# Patient Record
Sex: Female | Born: 1970 | Race: White | Hispanic: No | Smoking: Former smoker
Health system: Southern US, Community
[De-identification: ages and names within clinical notes are randomized; demographics above are authoritative.]

## PROBLEM LIST (undated history)

## (undated) DIAGNOSIS — N6310 Unspecified lump in the right breast, unspecified quadrant: Secondary | ICD-10-CM

## (undated) DIAGNOSIS — F419 Anxiety disorder, unspecified: Secondary | ICD-10-CM

## (undated) DIAGNOSIS — K625 Hemorrhage of anus and rectum: Secondary | ICD-10-CM

## (undated) DIAGNOSIS — M79671 Pain in right foot: Secondary | ICD-10-CM

## (undated) DIAGNOSIS — R519 Headache, unspecified: Secondary | ICD-10-CM

## (undated) DIAGNOSIS — D229 Melanocytic nevi, unspecified: Secondary | ICD-10-CM

## (undated) DIAGNOSIS — R51 Headache: Secondary | ICD-10-CM

## (undated) DIAGNOSIS — M47812 Spondylosis without myelopathy or radiculopathy, cervical region: Secondary | ICD-10-CM

## (undated) DIAGNOSIS — F329 Major depressive disorder, single episode, unspecified: Secondary | ICD-10-CM

## (undated) DIAGNOSIS — F32A Depression, unspecified: Secondary | ICD-10-CM

## (undated) DIAGNOSIS — E559 Vitamin D deficiency, unspecified: Secondary | ICD-10-CM

## (undated) DIAGNOSIS — Z8639 Personal history of other endocrine, nutritional and metabolic disease: Secondary | ICD-10-CM

## (undated) DIAGNOSIS — R928 Other abnormal and inconclusive findings on diagnostic imaging of breast: Secondary | ICD-10-CM

## (undated) HISTORY — DX: Vitamin D deficiency, unspecified: E55.9

## (undated) HISTORY — DX: Pain in right foot: M79.671

## (undated) HISTORY — DX: Melanocytic nevi, unspecified: D22.9

## (undated) HISTORY — PX: TUBAL LIGATION: SHX77

## (undated) HISTORY — PX: ENDOMETRIAL ABLATION: SHX621

## (undated) HISTORY — DX: Hemorrhage of anus and rectum: K62.5

## (undated) HISTORY — PX: WISDOM TOOTH EXTRACTION: SHX21

## (undated) HISTORY — DX: Spondylosis without myelopathy or radiculopathy, cervical region: M47.812

## (undated) HISTORY — DX: Personal history of other endocrine, nutritional and metabolic disease: Z86.39

## (undated) HISTORY — DX: Anxiety disorder, unspecified: F41.9

## (undated) HISTORY — DX: Major depressive disorder, single episode, unspecified: F32.9

## (undated) HISTORY — DX: Depression, unspecified: F32.A

---

## 1998-10-25 ENCOUNTER — Ambulatory Visit (HOSPITAL_COMMUNITY): Admission: RE | Admit: 1998-10-25 | Discharge: 1998-10-25 | Payer: Self-pay | Admitting: Obstetrics and Gynecology

## 1998-10-25 ENCOUNTER — Encounter: Payer: Self-pay | Admitting: Obstetrics and Gynecology

## 2002-12-01 ENCOUNTER — Other Ambulatory Visit: Admission: RE | Admit: 2002-12-01 | Discharge: 2002-12-01 | Payer: Self-pay | Admitting: Obstetrics and Gynecology

## 2003-12-12 ENCOUNTER — Other Ambulatory Visit: Admission: RE | Admit: 2003-12-12 | Discharge: 2003-12-12 | Payer: Self-pay | Admitting: Obstetrics and Gynecology

## 2004-01-24 ENCOUNTER — Ambulatory Visit (HOSPITAL_COMMUNITY): Admission: RE | Admit: 2004-01-24 | Discharge: 2004-01-24 | Payer: Self-pay | Admitting: Obstetrics and Gynecology

## 2004-12-12 ENCOUNTER — Other Ambulatory Visit: Admission: RE | Admit: 2004-12-12 | Discharge: 2004-12-12 | Payer: Self-pay | Admitting: Obstetrics and Gynecology

## 2005-12-29 ENCOUNTER — Other Ambulatory Visit: Admission: RE | Admit: 2005-12-29 | Discharge: 2005-12-29 | Payer: Self-pay | Admitting: Obstetrics and Gynecology

## 2011-04-05 ENCOUNTER — Emergency Department (HOSPITAL_COMMUNITY)
Admission: EM | Admit: 2011-04-05 | Discharge: 2011-04-05 | Disposition: A | Discharge status: Home / Self Care | Payer: BC Managed Care – PPO | Attending: Emergency Medicine | Admitting: Emergency Medicine

## 2011-04-05 DIAGNOSIS — F3289 Other specified depressive episodes: Secondary | ICD-10-CM | POA: Insufficient documentation

## 2011-04-05 DIAGNOSIS — K625 Hemorrhage of anus and rectum: Secondary | ICD-10-CM | POA: Insufficient documentation

## 2011-04-05 DIAGNOSIS — F329 Major depressive disorder, single episode, unspecified: Secondary | ICD-10-CM | POA: Insufficient documentation

## 2011-04-05 LAB — COMPREHENSIVE METABOLIC PANEL
ALT: 14 U/L (ref 0–35)
AST: 15 U/L (ref 0–37)
Albumin: 4.3 g/dL (ref 3.5–5.2)
Alkaline Phosphatase: 47 U/L (ref 39–117)
BUN: 9 mg/dL (ref 6–23)
CO2: 29 mEq/L (ref 19–32)
Calcium: 9.9 mg/dL (ref 8.4–10.5)
Chloride: 105 mEq/L (ref 96–112)
Creatinine, Ser: 0.76 mg/dL (ref 0.4–1.2)
GFR calc Af Amer: >60 mL/min (ref >60)
GFR calc non Af Amer: >60 mL/min (ref >60)
Glucose, Bld: 97 mg/dL (ref 70–99)
Potassium: 4.6 mEq/L (ref 3.5–5.1)
Sodium: 139 mEq/L (ref 135–145)
Total Bilirubin: 0.2 mg/dL — ABNORMAL LOW (ref 0.3–1.2)
Total Protein: 7 g/dL (ref 6.0–8.3)

## 2011-04-05 LAB — DIFFERENTIAL
Basophils Absolute: 0 10*3/uL (ref 0.0–0.1)
Basophils Relative: 0 % (ref 0–1)
Eosinophils Absolute: 0.3 10*3/uL (ref 0.0–0.7)
Eosinophils Relative: 3 % (ref 0–5)
Lymphocytes Relative: 46 % (ref 12–46)
Lymphs Abs: 5 10*3/uL — ABNORMAL HIGH (ref 0.7–4.0)
Monocytes Absolute: 0.6 10*3/uL (ref 0.1–1.0)
Monocytes Relative: 6 % (ref 3–12)
Neutro Abs: 4.8 10*3/uL (ref 1.7–7.7)
Neutrophils Relative %: 45 % (ref 43–77)

## 2011-04-05 LAB — CBC
HCT: 43.2 % (ref 36.0–46.0)
Hemoglobin: 14.5 g/dL (ref 12.0–15.0)
MCH: 30.6 pg (ref 26.0–34.0)
MCHC: 33.6 g/dL (ref 30.0–36.0)
MCV: 91.1 fL (ref 78.0–100.0)
Platelets: 145 10*3/uL — ABNORMAL LOW (ref 150–400)
RBC: 4.74 MIL/uL (ref 3.87–5.11)
RDW: 12.3 % (ref 11.5–15.5)
WBC: 10.7 10*3/uL — ABNORMAL HIGH (ref 4.0–10.5)

## 2011-04-05 LAB — URINALYSIS, ROUTINE W REFLEX MICROSCOPIC
Bilirubin Urine: NEGATIVE
Glucose, UA: NEGATIVE mg/dL
Hgb urine dipstick: NEGATIVE
Ketones, ur: NEGATIVE mg/dL
Nitrite: NEGATIVE
Protein, ur: NEGATIVE mg/dL
Specific Gravity, Urine: 1.009 (ref 1.005–1.030)
Urobilinogen, UA: 0.2 mg/dL (ref 0.0–1.0)
pH: 7 (ref 5.0–8.0)

## 2011-04-05 LAB — OCCULT BLOOD, POC DEVICE: Fecal Occult Bld: POSITIVE

## 2011-04-06 ENCOUNTER — Telehealth: Payer: Self-pay | Admitting: Internal Medicine

## 2011-04-06 ENCOUNTER — Encounter: Payer: Self-pay | Admitting: Internal Medicine

## 2011-04-06 NOTE — Telephone Encounter (Signed)
Error

## 2011-04-06 NOTE — Telephone Encounter (Signed)
Patient is going to see [facility] her primary care MD is working on an appt for her.  She will call back if she wants an appt here.

## 2011-08-01 ENCOUNTER — Emergency Department (HOSPITAL_COMMUNITY): Payer: BC Managed Care – PPO

## 2011-08-01 ENCOUNTER — Emergency Department (HOSPITAL_COMMUNITY)
Admission: EM | Admit: 2011-08-01 | Discharge: 2011-08-01 | Disposition: A | Discharge status: Home / Self Care | Payer: BC Managed Care – PPO | Attending: Emergency Medicine | Admitting: Emergency Medicine

## 2011-08-01 DIAGNOSIS — S5000XA Contusion of unspecified elbow, initial encounter: Secondary | ICD-10-CM | POA: Insufficient documentation

## 2011-08-01 DIAGNOSIS — S51009A Unspecified open wound of unspecified elbow, initial encounter: Secondary | ICD-10-CM | POA: Insufficient documentation

## 2011-08-01 DIAGNOSIS — Y9229 Other specified public building as the place of occurrence of the external cause: Secondary | ICD-10-CM | POA: Insufficient documentation

## 2011-08-01 DIAGNOSIS — W19XXXA Unspecified fall, initial encounter: Secondary | ICD-10-CM | POA: Insufficient documentation

## 2015-02-28 ENCOUNTER — Other Ambulatory Visit: Payer: Self-pay | Admitting: Family Medicine

## 2015-02-28 ENCOUNTER — Ambulatory Visit
Admission: RE | Admit: 2015-02-28 | Discharge: 2015-02-28 | Disposition: A | Discharge status: Home / Self Care | Payer: 59 | Source: Ambulatory Visit | Attending: Family Medicine | Admitting: Family Medicine

## 2015-02-28 DIAGNOSIS — M25531 Pain in right wrist: Secondary | ICD-10-CM

## 2015-10-24 ENCOUNTER — Other Ambulatory Visit: Payer: Self-pay | Admitting: Obstetrics and Gynecology

## 2015-10-24 DIAGNOSIS — R928 Other abnormal and inconclusive findings on diagnostic imaging of breast: Secondary | ICD-10-CM

## 2015-10-29 ENCOUNTER — Other Ambulatory Visit: Payer: Self-pay

## 2015-10-29 ENCOUNTER — Other Ambulatory Visit: Payer: Self-pay | Admitting: Obstetrics and Gynecology

## 2015-10-29 DIAGNOSIS — R928 Other abnormal and inconclusive findings on diagnostic imaging of breast: Secondary | ICD-10-CM

## 2015-10-31 ENCOUNTER — Ambulatory Visit
Admission: RE | Admit: 2015-10-31 | Discharge: 2015-10-31 | Disposition: A | Discharge status: Home / Self Care | Payer: 59 | Source: Ambulatory Visit | Attending: Obstetrics and Gynecology | Admitting: Obstetrics and Gynecology

## 2015-10-31 DIAGNOSIS — R928 Other abnormal and inconclusive findings on diagnostic imaging of breast: Secondary | ICD-10-CM

## 2015-12-30 ENCOUNTER — Other Ambulatory Visit: Payer: Self-pay | Admitting: Family Medicine

## 2015-12-30 ENCOUNTER — Ambulatory Visit
Admission: RE | Admit: 2015-12-30 | Discharge: 2015-12-30 | Disposition: A | Discharge status: Home / Self Care | Payer: 59 | Source: Ambulatory Visit | Attending: Family Medicine | Admitting: Family Medicine

## 2015-12-30 DIAGNOSIS — M542 Cervicalgia: Secondary | ICD-10-CM

## 2016-02-12 HISTORY — PX: CERVICAL DISC SURGERY: SHX588

## 2016-11-10 ENCOUNTER — Other Ambulatory Visit: Payer: Self-pay | Admitting: Obstetrics and Gynecology

## 2016-11-10 DIAGNOSIS — R928 Other abnormal and inconclusive findings on diagnostic imaging of breast: Secondary | ICD-10-CM

## 2016-11-16 ENCOUNTER — Ambulatory Visit
Admission: RE | Admit: 2016-11-16 | Discharge: 2016-11-16 | Disposition: A | Discharge status: Home / Self Care | Payer: BLUE CROSS/BLUE SHIELD | Source: Ambulatory Visit | Attending: Obstetrics and Gynecology | Admitting: Obstetrics and Gynecology

## 2016-11-16 ENCOUNTER — Other Ambulatory Visit: Payer: Self-pay | Admitting: Obstetrics and Gynecology

## 2016-11-16 DIAGNOSIS — N6489 Other specified disorders of breast: Secondary | ICD-10-CM

## 2016-11-16 DIAGNOSIS — R928 Other abnormal and inconclusive findings on diagnostic imaging of breast: Secondary | ICD-10-CM

## 2016-11-17 ENCOUNTER — Ambulatory Visit
Admission: RE | Admit: 2016-11-17 | Discharge: 2016-11-17 | Disposition: A | Discharge status: Home / Self Care | Payer: BLUE CROSS/BLUE SHIELD | Source: Ambulatory Visit | Attending: Obstetrics and Gynecology | Admitting: Obstetrics and Gynecology

## 2016-11-17 ENCOUNTER — Other Ambulatory Visit: Payer: Self-pay | Admitting: Obstetrics and Gynecology

## 2016-11-17 ENCOUNTER — Other Ambulatory Visit: Payer: 59

## 2016-11-17 DIAGNOSIS — N6489 Other specified disorders of breast: Secondary | ICD-10-CM

## 2016-11-23 ENCOUNTER — Ambulatory Visit
Admission: RE | Admit: 2016-11-23 | Discharge: 2016-11-23 | Disposition: A | Discharge status: Home / Self Care | Payer: BLUE CROSS/BLUE SHIELD | Source: Ambulatory Visit | Attending: Obstetrics and Gynecology | Admitting: Obstetrics and Gynecology

## 2016-11-23 DIAGNOSIS — N6489 Other specified disorders of breast: Secondary | ICD-10-CM

## 2016-12-11 ENCOUNTER — Other Ambulatory Visit: Payer: Self-pay | Admitting: General Surgery

## 2016-12-11 DIAGNOSIS — R928 Other abnormal and inconclusive findings on diagnostic imaging of breast: Secondary | ICD-10-CM

## 2016-12-23 ENCOUNTER — Encounter (HOSPITAL_BASED_OUTPATIENT_CLINIC_OR_DEPARTMENT_OTHER)
Admission: RE | Admit: 2016-12-23 | Discharge: 2016-12-23 | Disposition: A | Discharge status: Home / Self Care | Payer: BLUE CROSS/BLUE SHIELD | Source: Ambulatory Visit | Attending: General Surgery | Admitting: General Surgery

## 2016-12-23 ENCOUNTER — Encounter (HOSPITAL_BASED_OUTPATIENT_CLINIC_OR_DEPARTMENT_OTHER): Payer: Self-pay | Admitting: *Deleted

## 2016-12-23 DIAGNOSIS — Z01812 Encounter for preprocedural laboratory examination: Secondary | ICD-10-CM | POA: Diagnosis not present

## 2016-12-23 DIAGNOSIS — Z79899 Other long term (current) drug therapy: Secondary | ICD-10-CM | POA: Diagnosis not present

## 2016-12-23 DIAGNOSIS — Z8261 Family history of arthritis: Secondary | ICD-10-CM | POA: Diagnosis not present

## 2016-12-23 DIAGNOSIS — R928 Other abnormal and inconclusive findings on diagnostic imaging of breast: Secondary | ICD-10-CM | POA: Insufficient documentation

## 2016-12-23 DIAGNOSIS — Z87891 Personal history of nicotine dependence: Secondary | ICD-10-CM | POA: Diagnosis not present

## 2016-12-23 DIAGNOSIS — Z803 Family history of malignant neoplasm of breast: Secondary | ICD-10-CM | POA: Diagnosis not present

## 2016-12-23 DIAGNOSIS — Z9889 Other specified postprocedural states: Secondary | ICD-10-CM | POA: Insufficient documentation

## 2016-12-23 DIAGNOSIS — Z811 Family history of alcohol abuse and dependence: Secondary | ICD-10-CM | POA: Insufficient documentation

## 2016-12-23 DIAGNOSIS — Z880 Allergy status to penicillin: Secondary | ICD-10-CM | POA: Insufficient documentation

## 2016-12-23 DIAGNOSIS — Z818 Family history of other mental and behavioral disorders: Secondary | ICD-10-CM | POA: Insufficient documentation

## 2016-12-23 LAB — COMPREHENSIVE METABOLIC PANEL
ALBUMIN: 4.3 g/dL (ref 3.5–5.0)
ALK PHOS: 62 U/L (ref 38–126)
ALT: 17 U/L (ref 14–54)
ANION GAP: 10 (ref 5–15)
AST: 20 U/L (ref 15–41)
BILIRUBIN TOTAL: 0.3 mg/dL (ref 0.3–1.2)
BUN: 16 mg/dL (ref 6–20)
CALCIUM: 10.2 mg/dL (ref 8.9–10.3)
CO2: 27 mmol/L (ref 22–32)
CREATININE: 0.74 mg/dL (ref 0.44–1.00)
Chloride: 102 mmol/L (ref 101–111)
GFR calc Af Amer: >60 mL/min (ref >60)
GFR calc non Af Amer: >60 mL/min (ref >60)
GLUCOSE: 103 mg/dL — AB (ref 65–99)
Potassium: 4.7 mmol/L (ref 3.5–5.1)
SODIUM: 139 mmol/L (ref 135–145)
TOTAL PROTEIN: 7.3 g/dL (ref 6.5–8.1)

## 2016-12-23 LAB — CBC WITH DIFFERENTIAL/PLATELET
BASOS PCT: 0 %
Basophils Absolute: 0 10*3/uL (ref 0.0–0.1)
EOS ABS: 0.1 10*3/uL (ref 0.0–0.7)
Eosinophils Relative: 1 %
HEMATOCRIT: 42.6 % (ref 36.0–46.0)
HEMOGLOBIN: 14.3 g/dL (ref 12.0–15.0)
Lymphocytes Relative: 33 %
Lymphs Abs: 2.7 10*3/uL (ref 0.7–4.0)
MCH: 30 pg (ref 26.0–34.0)
MCHC: 33.6 g/dL (ref 30.0–36.0)
MCV: 89.5 fL (ref 78.0–100.0)
MONOS PCT: 6 %
Monocytes Absolute: 0.5 10*3/uL (ref 0.1–1.0)
NEUTROS ABS: 5 10*3/uL (ref 1.7–7.7)
NEUTROS PCT: 60 %
Platelets: 186 10*3/uL (ref 150–400)
RBC: 4.76 MIL/uL (ref 3.87–5.11)
RDW: 12.7 % (ref 11.5–15.5)
WBC: 8.3 10*3/uL (ref 4.0–10.5)

## 2016-12-23 NOTE — Progress Notes (Signed)
Patient came directly over from [facility] after being scheduled for surgery, concerned that she was just treated yesterday for peritonsillar abscess. She was given Clindamycin and has already taken 3 doses and states she feel better already. States no fever and that now she can swallow and eat. Told her to make sure she finishes all of her antibiotics and to call if she does not improve.

## 2016-12-24 ENCOUNTER — Other Ambulatory Visit: Payer: Self-pay | Admitting: General Surgery

## 2016-12-24 DIAGNOSIS — R928 Other abnormal and inconclusive findings on diagnostic imaging of breast: Secondary | ICD-10-CM

## 2017-01-12 ENCOUNTER — Ambulatory Visit
Admission: RE | Admit: 2017-01-12 | Discharge: 2017-01-12 | Disposition: A | Discharge status: Home / Self Care | Payer: BLUE CROSS/BLUE SHIELD | Source: Ambulatory Visit | Attending: General Surgery | Admitting: General Surgery

## 2017-01-12 DIAGNOSIS — R928 Other abnormal and inconclusive findings on diagnostic imaging of breast: Secondary | ICD-10-CM

## 2017-01-12 NOTE — H&P (Signed)
Meredeth Ide Location: [facility] Patient #: [id number] DOB: [birth year] Single / Language: [place] / Race: White Female       History of Present Illness  The patient is a 46 year old female who presents with a complaint of Breast problems. This is a healthy 46 year old female, referred to me by Dr. 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 She works at [employer] and knows Rhett Bannister, a friend of mine.  She has not had any breast problems in the past. Recent screening mammogram showed category C density, focal area of distortion in the right breast upper outer quadrant. No mass or calcifications. Core biopsy shows complex sclerosing lesion. This was felt to be a category 4 mammogram. Excision was recommended by the radiologist.  Comorbidities are minimal. She's had cervical disc surgery in March of this year and did well. Bilateral tubal ligation. Takes Zoloft. Family history reveals mother diagnosed with breast cancer age 78. Living and survived. No ovarian cancer. Socially she is married. Lives in [place]. Her husband is with her today. She has 2 children from previous marriage. Quit smoking 10 months ago. Takes alcohol rarely. 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  We had a long talk about complex sclerosing lesion. I told her that the risk of cancer or upgrade to atypia was somewhere between 4 and 9%. Told her she probably did not have cancer. She would like to have this area excised conservatively. I certainly offered that to her and we will plan that. She will be scheduled for right breast lumpectomy with radioactive seed localization. I discussed the indications, techniques, incisions, and risk of the surgery in detail. She understands all of these issues. All of her questions are answered. She  agrees with this plan.   Past Surgical History  Breast Biopsy  Right. Oral Surgery  Spinal Surgery - Neck   Diagnostic Studies History  Colonoscopy  1-5 years ago Mammogram  within last year Pap Smear  1-5 years ago  Allergies  Ampicillin *PENICILLINS*   Medication History  Diclofenac Sodium (75MG  Tablet DR, Oral daily) Active. Zoloft (50MG  Tablet, Oral daily) Active. Tylenol (325MG  Tablet, Oral daily) Active. Calcium + D (150-261-330MG -MG-IU Capsule, Oral daily) Active. Vitamin D (Cholecalciferol) (1000UNIT Tablet, Oral daily) Active. Medications Reconciled  Social History  Alcohol use  Occasional alcohol use. Caffeine use  Coffee. No drug use  Tobacco use  Former smoker.  Family History  Alcohol Abuse  Brother, Father. Arthritis  Mother. Breast Cancer  Mother. Colon Polyps  Mother. Depression  Mother. Heart Disease  Father. Hypertension  Mother. Ischemic Bowel Disease  Mother. Migraine Headache  Mother, Son.  Pregnancy / Birth History  Age at menarche  11 years. Gravida  2 Maternal age  54-25 Para  2  Other Problems Anxiety Disorder  Back Pain  Bladder Problems  Hemorrhoids  Migraine Headache     Review of Systems  General Present- Weight Gain. Not Present- Appetite Loss, Chills, Fatigue, Fever, Night Sweats and Weight Loss. Skin Not Present- Change in Wart/Mole, Dryness, Hives, Jaundice, New Lesions, Non-Healing Wounds, Rash and Ulcer. HEENT Present- Hearing Loss, Seasonal Allergies and Wears glasses/contact lenses. Not Present- Earache, Hoarseness, Nose Bleed, Oral Ulcers, Ringing in the Ears, Sinus Pain, Sore Throat, Visual Disturbances and Yellow Eyes. Respiratory Present- Snoring. Not Present- Bloody sputum, Chronic Cough, Difficulty Breathing and Wheezing. Breast  Present- Breast Mass. Not Present- Breast Pain, Nipple Discharge and Skin Changes. Cardiovascular Present- Palpitations. Not Present- Chest Pain,  Difficulty Breathing Lying Down, Leg Cramps, Rapid Heart Rate, Shortness of Breath and Swelling of Extremities. Gastrointestinal Present- Bloating and Hemorrhoids. Not Present- Abdominal Pain, Bloody Stool, Change in Bowel Habits, Chronic diarrhea, Constipation, Difficulty Swallowing, Excessive gas, Gets full quickly at meals, Indigestion, Nausea, Rectal Pain and Vomiting. Female Genitourinary Present- Frequency and Nocturia. Not Present- Painful Urination, Pelvic Pain and Urgency. Musculoskeletal Present- Joint Pain, Joint Stiffness and Muscle Pain. Not Present- Back Pain, Muscle Weakness and Swelling of Extremities. Neurological Not Present- Decreased Memory, Fainting, Headaches, Numbness, Seizures, Tingling, Tremor, Trouble walking and Weakness. Psychiatric Present- Anxiety. Not Present- Bipolar, Change in Sleep Pattern, Depression, Fearful and Frequent crying. Endocrine Present- Hot flashes. Not Present- Cold Intolerance, Excessive Hunger, Hair Changes, Heat Intolerance and New Diabetes. Hematology Not Present- Blood Thinners, Easy Bruising, Excessive bleeding, Gland problems, HIV and Persistent Infections.  Vitals Weight: 178.2 lb Height: 69in Body Surface Area: 1.97 m Body Mass Index: 26.32 kg/m  Temp.: 98.37F  Pulse: 84 (Regular)  BP: 118/70 (Sitting, Left Arm, Standard)     Physical Exam  General Mental Status-Alert. General Appearance-Consistent with stated age. Hydration-Well hydrated. Voice-Normal.  Head and Neck Head-normocephalic, atraumatic with no lesions or palpable masses. Trachea-midline. Thyroid Gland Characteristics - normal size and consistency.  Eye Eyeball - Bilateral-Extraocular movements intact. Sclera/Conjunctiva - Bilateral-No scleral icterus.  Chest and Lung Exam Chest and lung exam reveals -quiet, even and easy respiratory effort with no use of accessory muscles and on auscultation, normal breath sounds, no adventitious  sounds and normal vocal resonance. Inspection Chest Wall - Normal. Back - normal.  Breast Note: Breasts are medium to medium large in size. Biopsy site lateral right breast. No hematoma. No palpable mass in either breast. Nipple and areolar complex is normal. No axillary adenopathy.   Cardiovascular Cardiovascular examination reveals -normal heart sounds, regular rate and rhythm with no murmurs and normal pedal pulses bilaterally.  Abdomen Inspection Inspection of the abdomen reveals - No Hernias. Skin - Scar - no surgical scars. Palpation/Percussion Palpation and Percussion of the abdomen reveal - Soft, Non Tender, No Rebound tenderness, No Rigidity (guarding) and No hepatosplenomegaly. Auscultation Auscultation of the abdomen reveals - Bowel sounds normal.  Neurologic Neurologic evaluation reveals -alert and oriented x 3 with no impairment of recent or remote memory. Mental Status-Normal.  Musculoskeletal Normal Exam - Left-Upper Extremity Strength Normal and Lower Extremity Strength Normal. Normal Exam - Right-Upper Extremity Strength Normal and Lower Extremity Strength Normal.  Lymphatic Head & Neck  General Head & Neck Lymphatics: Bilateral - Description - Normal. Axillary  General Axillary Region: Bilateral - Description - Normal. Tenderness - Non Tender. Femoral & Inguinal  Generalized Femoral & Inguinal Lymphatics: Bilateral - Description - Normal. Tenderness - Non Tender.    Assessment & Plan  ABNORMAL MAMMOGRAM OF RIGHT BREAST (R92.8)   Your recent mammograms and biopsy show an area of distortion in the right breast, upper outer quadrant. The needle biopsy shows a complex sclerosing lesion The literature documents a 4-9% chance of upgrading this to atypia or in situ cancer  Most likely you do not have cancer We have discussed this in detail We have decided to proceed with right breast lumpectomy with radioactive seed localization to be 100% sure  of your diagnosis We have discussed the indications, techniques, and risks of this surgery in detail  Please read the printed information that we have given  you  FAMILY HISTORY OF BREAST CANCER IN FIRST DEGREE RELATIVE (Z80.3)     Angelia MouldHaywood M. Derrell LollingIngram, M.D., [facility] General and Minimally invasive Surgery Breast and Colorectal Surgery Office:   [phone] Pager:   [phone]

## 2017-01-13 ENCOUNTER — Encounter (HOSPITAL_BASED_OUTPATIENT_CLINIC_OR_DEPARTMENT_OTHER)
Admission: RE | Disposition: A | Discharge status: Home / Self Care | Payer: Self-pay | Source: Ambulatory Visit | Attending: General Surgery

## 2017-01-13 ENCOUNTER — Ambulatory Visit (HOSPITAL_BASED_OUTPATIENT_CLINIC_OR_DEPARTMENT_OTHER): Payer: BLUE CROSS/BLUE SHIELD | Admitting: Anesthesiology

## 2017-01-13 ENCOUNTER — Ambulatory Visit
Admission: RE | Admit: 2017-01-13 | Discharge: 2017-01-13 | Disposition: A | Discharge status: Home / Self Care | Payer: BLUE CROSS/BLUE SHIELD | Source: Ambulatory Visit | Attending: General Surgery | Admitting: General Surgery

## 2017-01-13 ENCOUNTER — Encounter (HOSPITAL_BASED_OUTPATIENT_CLINIC_OR_DEPARTMENT_OTHER): Payer: Self-pay | Admitting: Anesthesiology

## 2017-01-13 ENCOUNTER — Ambulatory Visit (HOSPITAL_BASED_OUTPATIENT_CLINIC_OR_DEPARTMENT_OTHER)
Admission: RE | Admit: 2017-01-13 | Discharge: 2017-01-13 | Disposition: A | Discharge status: Home / Self Care | Payer: BLUE CROSS/BLUE SHIELD | Source: Ambulatory Visit | Attending: General Surgery | Admitting: General Surgery

## 2017-01-13 DIAGNOSIS — N6021 Fibroadenosis of right breast: Secondary | ICD-10-CM | POA: Insufficient documentation

## 2017-01-13 DIAGNOSIS — Z820 Family history of epilepsy and other diseases of the nervous system: Secondary | ICD-10-CM | POA: Diagnosis not present

## 2017-01-13 DIAGNOSIS — F419 Anxiety disorder, unspecified: Secondary | ICD-10-CM | POA: Insufficient documentation

## 2017-01-13 DIAGNOSIS — Z803 Family history of malignant neoplasm of breast: Secondary | ICD-10-CM | POA: Insufficient documentation

## 2017-01-13 DIAGNOSIS — R928 Other abnormal and inconclusive findings on diagnostic imaging of breast: Secondary | ICD-10-CM

## 2017-01-13 DIAGNOSIS — Z818 Family history of other mental and behavioral disorders: Secondary | ICD-10-CM | POA: Diagnosis not present

## 2017-01-13 DIAGNOSIS — Z880 Allergy status to penicillin: Secondary | ICD-10-CM | POA: Insufficient documentation

## 2017-01-13 DIAGNOSIS — Z811 Family history of alcohol abuse and dependence: Secondary | ICD-10-CM | POA: Diagnosis not present

## 2017-01-13 DIAGNOSIS — Z8249 Family history of ischemic heart disease and other diseases of the circulatory system: Secondary | ICD-10-CM | POA: Insufficient documentation

## 2017-01-13 DIAGNOSIS — Z8379 Family history of other diseases of the digestive system: Secondary | ICD-10-CM | POA: Insufficient documentation

## 2017-01-13 DIAGNOSIS — Z79899 Other long term (current) drug therapy: Secondary | ICD-10-CM | POA: Diagnosis not present

## 2017-01-13 DIAGNOSIS — Z791 Long term (current) use of non-steroidal anti-inflammatories (NSAID): Secondary | ICD-10-CM | POA: Insufficient documentation

## 2017-01-13 DIAGNOSIS — Z8371 Family history of colonic polyps: Secondary | ICD-10-CM | POA: Insufficient documentation

## 2017-01-13 DIAGNOSIS — G43909 Migraine, unspecified, not intractable, without status migrainosus: Secondary | ICD-10-CM | POA: Diagnosis not present

## 2017-01-13 DIAGNOSIS — Z8261 Family history of arthritis: Secondary | ICD-10-CM | POA: Insufficient documentation

## 2017-01-13 DIAGNOSIS — N6011 Diffuse cystic mastopathy of right breast: Secondary | ICD-10-CM | POA: Diagnosis not present

## 2017-01-13 DIAGNOSIS — Z87891 Personal history of nicotine dependence: Secondary | ICD-10-CM | POA: Diagnosis not present

## 2017-01-13 HISTORY — DX: Other abnormal and inconclusive findings on diagnostic imaging of breast: R92.8

## 2017-01-13 HISTORY — DX: Headache, unspecified: R51.9

## 2017-01-13 HISTORY — DX: Unspecified lump in the right breast, unspecified quadrant: N63.10

## 2017-01-13 HISTORY — DX: Headache: R51

## 2017-01-13 HISTORY — PX: BREAST LUMPECTOMY WITH RADIOACTIVE SEED LOCALIZATION: SHX6424

## 2017-01-13 SURGERY — BREAST LUMPECTOMY WITH RADIOACTIVE SEED LOCALIZATION
Anesthesia: General | Site: Breast | Laterality: Right

## 2017-01-13 MED ORDER — ONDANSETRON HCL 4 MG/2ML IJ SOLN
INTRAMUSCULAR | Status: DC | PRN
  Administered 2017-01-13: 4 mg via INTRAVENOUS

## 2017-01-13 MED ORDER — CHLORHEXIDINE GLUCONATE CLOTH 2 % EX PADS: 6.0000 | MEDICATED_PAD | Freq: Once | CUTANEOUS | Status: DC

## 2017-01-13 MED ORDER — GABAPENTIN 300 MG PO CAPS
300.0000 mg | ORAL_CAPSULE | ORAL | Status: AC
  Administered 2017-01-13: 300 mg via ORAL

## 2017-01-13 MED ORDER — LIDOCAINE 2% (20 MG/ML) 5 ML SYRINGE
INTRAMUSCULAR | Status: AC
  Filled 2017-01-13: qty 5

## 2017-01-13 MED ORDER — FENTANYL CITRATE (PF) 100 MCG/2ML IJ SOLN
INTRAMUSCULAR | Status: AC
  Filled 2017-01-13: qty 2

## 2017-01-13 MED ORDER — DEXAMETHASONE SODIUM PHOSPHATE 4 MG/ML IJ SOLN
INTRAMUSCULAR | Status: DC | PRN
  Administered 2017-01-13: 10 mg via INTRAVENOUS

## 2017-01-13 MED ORDER — FENTANYL CITRATE (PF) 100 MCG/2ML IJ SOLN: 25.0000 ug | INTRAMUSCULAR | Status: DC | PRN

## 2017-01-13 MED ORDER — PHENYLEPHRINE 40 MCG/ML (10ML) SYRINGE FOR IV PUSH (FOR BLOOD PRESSURE SUPPORT)
PREFILLED_SYRINGE | INTRAVENOUS | Status: AC
  Filled 2017-01-13: qty 10

## 2017-01-13 MED ORDER — MIDAZOLAM HCL 2 MG/2ML IJ SOLN: 1.0000 mg | INTRAMUSCULAR | Status: DC | PRN

## 2017-01-13 MED ORDER — SODIUM CHLORIDE 0.9% FLUSH: 3.0000 mL | Freq: Two times a day (BID) | INTRAVENOUS | Status: DC

## 2017-01-13 MED ORDER — LACTATED RINGERS IV SOLN
INTRAVENOUS | Status: DC
  Administered 2017-01-13: 09:00:00 via INTRAVENOUS
  Administered 2017-01-13: 10 mL/h via INTRAVENOUS

## 2017-01-13 MED ORDER — OXYCODONE HCL 5 MG PO TABS
ORAL_TABLET | ORAL | Status: AC
  Filled 2017-01-13: qty 1

## 2017-01-13 MED ORDER — CELECOXIB 400 MG PO CAPS
400.0000 mg | ORAL_CAPSULE | ORAL | Status: AC
  Administered 2017-01-13: 400 mg via ORAL

## 2017-01-13 MED ORDER — MEPERIDINE HCL 25 MG/ML IJ SOLN: 6.2500 mg | INTRAMUSCULAR | Status: DC | PRN

## 2017-01-13 MED ORDER — ACETAMINOPHEN 325 MG PO TABS: 650.0000 mg | ORAL_TABLET | ORAL | Status: DC | PRN

## 2017-01-13 MED ORDER — LACTATED RINGERS IV SOLN: INTRAVENOUS | Status: DC

## 2017-01-13 MED ORDER — DEXAMETHASONE SODIUM PHOSPHATE 10 MG/ML IJ SOLN
INTRAMUSCULAR | Status: AC
  Filled 2017-01-13: qty 1

## 2017-01-13 MED ORDER — PHENYLEPHRINE HCL 10 MG/ML IJ SOLN
INTRAMUSCULAR | Status: DC | PRN
  Administered 2017-01-13 (×3): 80 ug via INTRAVENOUS

## 2017-01-13 MED ORDER — EPHEDRINE SULFATE 50 MG/ML IJ SOLN
INTRAMUSCULAR | Status: DC | PRN
  Administered 2017-01-13: 10 mg via INTRAVENOUS

## 2017-01-13 MED ORDER — HYDROMORPHONE HCL 1 MG/ML IJ SOLN
0.2500 mg | INTRAMUSCULAR | Status: DC | PRN
  Administered 2017-01-13: 0.5 mg via INTRAVENOUS

## 2017-01-13 MED ORDER — GABAPENTIN 300 MG PO CAPS
ORAL_CAPSULE | ORAL | Status: AC
  Filled 2017-01-13: qty 1

## 2017-01-13 MED ORDER — ACETAMINOPHEN 650 MG RE SUPP: 650.0000 mg | RECTAL | Status: DC | PRN

## 2017-01-13 MED ORDER — OXYCODONE HCL 5 MG PO TABS: 5.0000 mg | ORAL_TABLET | ORAL | Status: DC | PRN

## 2017-01-13 MED ORDER — SODIUM CHLORIDE 0.9 % IV SOLN: 250.0000 mL | INTRAVENOUS | Status: DC | PRN

## 2017-01-13 MED ORDER — HYDROCODONE-ACETAMINOPHEN 5-325 MG PO TABS: 1.0000 | ORAL_TABLET | Freq: Four times a day (QID) | ORAL | 0 refills | Status: DC | PRN

## 2017-01-13 MED ORDER — PROPOFOL 10 MG/ML IV BOLUS
INTRAVENOUS | Status: AC
  Filled 2017-01-13: qty 20

## 2017-01-13 MED ORDER — ACETAMINOPHEN 500 MG PO TABS
1000.0000 mg | ORAL_TABLET | ORAL | Status: AC
  Administered 2017-01-13: 1000 mg via ORAL

## 2017-01-13 MED ORDER — MIDAZOLAM HCL 5 MG/5ML IJ SOLN
INTRAMUSCULAR | Status: DC | PRN
  Administered 2017-01-13: 2 mg via INTRAVENOUS

## 2017-01-13 MED ORDER — CEFAZOLIN SODIUM-DEXTROSE 2-4 GM/100ML-% IV SOLN
2.0000 g | INTRAVENOUS | Status: AC
  Administered 2017-01-13: 2 g via INTRAVENOUS

## 2017-01-13 MED ORDER — SODIUM CHLORIDE 0.9% FLUSH: 3.0000 mL | INTRAVENOUS | Status: DC | PRN

## 2017-01-13 MED ORDER — ACETAMINOPHEN 500 MG PO TABS
ORAL_TABLET | ORAL | Status: AC
  Filled 2017-01-13: qty 2

## 2017-01-13 MED ORDER — SCOPOLAMINE 1 MG/3DAYS TD PT72: 1.0000 | MEDICATED_PATCH | Freq: Once | TRANSDERMAL | Status: DC | PRN

## 2017-01-13 MED ORDER — OXYCODONE HCL 5 MG/5ML PO SOLN: 5.0000 mg | Freq: Once | ORAL | Status: AC | PRN

## 2017-01-13 MED ORDER — HYDROMORPHONE HCL 1 MG/ML IJ SOLN
INTRAMUSCULAR | Status: AC
  Filled 2017-01-13: qty 1

## 2017-01-13 MED ORDER — FENTANYL CITRATE (PF) 100 MCG/2ML IJ SOLN
INTRAMUSCULAR | Status: DC | PRN
  Administered 2017-01-13: 100 ug via INTRAVENOUS

## 2017-01-13 MED ORDER — BUPIVACAINE-EPINEPHRINE (PF) 0.5% -1:200000 IJ SOLN
INTRAMUSCULAR | Status: DC | PRN
  Administered 2017-01-13: 10 mL

## 2017-01-13 MED ORDER — OXYCODONE HCL 5 MG PO TABS
5.0000 mg | ORAL_TABLET | Freq: Once | ORAL | Status: AC | PRN
  Administered 2017-01-13: 5 mg via ORAL

## 2017-01-13 MED ORDER — PROPOFOL 10 MG/ML IV BOLUS
INTRAVENOUS | Status: DC | PRN
  Administered 2017-01-13: 200 mg via INTRAVENOUS

## 2017-01-13 MED ORDER — FENTANYL CITRATE (PF) 100 MCG/2ML IJ SOLN: 50.0000 ug | INTRAMUSCULAR | Status: DC | PRN

## 2017-01-13 MED ORDER — MIDAZOLAM HCL 2 MG/2ML IJ SOLN
INTRAMUSCULAR | Status: AC
  Filled 2017-01-13: qty 2

## 2017-01-13 MED ORDER — LIDOCAINE HCL (CARDIAC) 20 MG/ML IV SOLN
INTRAVENOUS | Status: DC | PRN
  Administered 2017-01-13: 30 mg via INTRAVENOUS

## 2017-01-13 MED ORDER — PROMETHAZINE HCL 25 MG/ML IJ SOLN: 6.2500 mg | INTRAMUSCULAR | Status: DC | PRN

## 2017-01-13 MED ORDER — ONDANSETRON HCL 4 MG/2ML IJ SOLN
INTRAMUSCULAR | Status: AC
  Filled 2017-01-13: qty 2

## 2017-01-13 MED ORDER — EPHEDRINE 5 MG/ML INJ
INTRAVENOUS | Status: AC
  Filled 2017-01-13: qty 10

## 2017-01-13 MED ORDER — CELECOXIB 200 MG PO CAPS
ORAL_CAPSULE | ORAL | Status: AC
  Filled 2017-01-13: qty 2

## 2017-01-13 MED ORDER — CEFAZOLIN SODIUM-DEXTROSE 2-4 GM/100ML-% IV SOLN
INTRAVENOUS | Status: AC
  Filled 2017-01-13: qty 100

## 2017-01-13 SURGICAL SUPPLY — 57 items
APPLIER CLIP 9.375 MED OPEN (MISCELLANEOUS) ×2
BENZOIN TINCTURE PRP APPL 2/3 (GAUZE/BANDAGES/DRESSINGS) IMPLANT
BINDER BREAST LRG (GAUZE/BANDAGES/DRESSINGS) ×2 IMPLANT
BINDER BREAST MEDIUM (GAUZE/BANDAGES/DRESSINGS) IMPLANT
BINDER BREAST XLRG (GAUZE/BANDAGES/DRESSINGS) IMPLANT
BINDER BREAST XXLRG (GAUZE/BANDAGES/DRESSINGS) IMPLANT
BLADE HEX COATED 2.75 (ELECTRODE) ×2 IMPLANT
BLADE SURG 10 STRL SS (BLADE) IMPLANT
BLADE SURG 15 STRL LF DISP TIS (BLADE) ×1 IMPLANT
BLADE SURG 15 STRL SS (BLADE) ×1
CANISTER SUC SOCK COL 7IN (MISCELLANEOUS) IMPLANT
CANISTER SUCT 1200ML W/VALVE (MISCELLANEOUS) ×2 IMPLANT
CHLORAPREP W/TINT 26ML (MISCELLANEOUS) ×2 IMPLANT
CLIP APPLIE 9.375 MED OPEN (MISCELLANEOUS) ×1 IMPLANT
COVER BACK TABLE 60X90IN (DRAPES) ×2 IMPLANT
COVER MAYO STAND STRL (DRAPES) ×2 IMPLANT
COVER PROBE W GEL 5X96 (DRAPES) ×2 IMPLANT
DECANTER SPIKE VIAL GLASS SM (MISCELLANEOUS) IMPLANT
DERMABOND ADVANCED (GAUZE/BANDAGES/DRESSINGS) ×1
DERMABOND ADVANCED .7 DNX12 (GAUZE/BANDAGES/DRESSINGS) ×1 IMPLANT
DEVICE DUBIN W/COMP PLATE 8390 (MISCELLANEOUS) ×2 IMPLANT
DRAPE LAPAROSCOPIC ABDOMINAL (DRAPES) ×2 IMPLANT
DRAPE UTILITY XL STRL (DRAPES) ×2 IMPLANT
DRSG PAD ABDOMINAL 8X10 ST (GAUZE/BANDAGES/DRESSINGS) IMPLANT
ELECT REM PT RETURN 9FT ADLT (ELECTROSURGICAL) ×2
ELECTRODE REM PT RTRN 9FT ADLT (ELECTROSURGICAL) ×1 IMPLANT
GLOVE EUDERMIC 7 POWDERFREE (GLOVE) ×2 IMPLANT
GOWN STRL REUS W/ TWL LRG LVL3 (GOWN DISPOSABLE) ×1 IMPLANT
GOWN STRL REUS W/ TWL XL LVL3 (GOWN DISPOSABLE) ×1 IMPLANT
GOWN STRL REUS W/TWL LRG LVL3 (GOWN DISPOSABLE) ×1
GOWN STRL REUS W/TWL XL LVL3 (GOWN DISPOSABLE) ×1
ILLUMINATOR WAVEGUIDE N/F (MISCELLANEOUS) IMPLANT
KIT MARKER MARGIN INK (KITS) ×2 IMPLANT
LIGHT WAVEGUIDE WIDE FLAT (MISCELLANEOUS) IMPLANT
NEEDLE HYPO 25X1 1.5 SAFETY (NEEDLE) ×2 IMPLANT
NS IRRIG 1000ML POUR BTL (IV SOLUTION) ×2 IMPLANT
PACK BASIN DAY SURGERY FS (CUSTOM PROCEDURE TRAY) ×2 IMPLANT
PENCIL BUTTON HOLSTER BLD 10FT (ELECTRODE) ×2 IMPLANT
SHEET MEDIUM DRAPE 40X70 STRL (DRAPES) IMPLANT
SLEEVE SCD COMPRESS KNEE MED (MISCELLANEOUS) ×2 IMPLANT
SPONGE GAUZE 4X4 12PLY STER LF (GAUZE/BANDAGES/DRESSINGS) IMPLANT
SPONGE LAP 18X18 X RAY DECT (DISPOSABLE) IMPLANT
SPONGE LAP 4X18 X RAY DECT (DISPOSABLE) ×2 IMPLANT
STRIP CLOSURE SKIN 1/2X4 (GAUZE/BANDAGES/DRESSINGS) IMPLANT
SUT ETHILON 3 0 FSL (SUTURE) IMPLANT
SUT MNCRL AB 4-0 PS2 18 (SUTURE) ×2 IMPLANT
SUT SILK 2 0 SH (SUTURE) ×2 IMPLANT
SUT VIC AB 2-0 CT1 27 (SUTURE)
SUT VIC AB 2-0 CT1 TAPERPNT 27 (SUTURE) IMPLANT
SUT VIC AB 3-0 SH 27 (SUTURE)
SUT VIC AB 3-0 SH 27X BRD (SUTURE) IMPLANT
SUT VICRYL 3-0 CR8 SH (SUTURE) ×2 IMPLANT
SYR 10ML LL (SYRINGE) ×2 IMPLANT
TOWEL OR 17X24 6PK STRL BLUE (TOWEL DISPOSABLE) ×2 IMPLANT
TOWEL OR NON WOVEN STRL DISP B (DISPOSABLE) IMPLANT
TUBE CONNECTING 20X1/4 (TUBING) ×2 IMPLANT
YANKAUER SUCT BULB TIP NO VENT (SUCTIONS) ×2 IMPLANT

## 2017-01-13 NOTE — Interval H&P Note (Signed)
History and Physical Interval Note:  01/13/2017 7:49 AM  Shannon Padilla  has presented today for surgery, with the diagnosis of ABNORMAL MAMMOGRAM RIGHT BREAST  The various methods of treatment have been discussed with the patient and family. After consideration of risks, benefits and other options for treatment, the patient has consented to  Procedure(s): RIGHT BREAST LUMPECTOMY WITH RADIOACTIVE SEED LOCALIZATION (Right) as a surgical intervention .  The patient's history has been reviewed, patient examined, no change in status, stable for surgery.  I have reviewed the patient's chart and labs.  Questions were answered to the patient's satisfaction.     Ernestene MentionINGRAM,[name] M

## 2017-01-13 NOTE — Anesthesia Preprocedure Evaluation (Signed)
Anesthesia Evaluation  Patient identified by MRN, date of birth, ID band Patient awake    Reviewed: Allergy & Precautions, NPO status , Patient's Chart, lab work & pertinent test results  Airway Mallampati: II  TM Distance: >3 FB Neck ROM: Full    Dental no notable dental hx.    Pulmonary neg pulmonary ROS, former smoker,    Pulmonary exam normal breath sounds clear to auscultation       Cardiovascular negative cardio ROS Normal cardiovascular exam Rhythm:Regular Rate:Normal     Neuro/Psych  Headaches, negative neurological ROS  negative psych ROS   GI/Hepatic negative GI ROS, Neg liver ROS,   Endo/Other  negative endocrine ROS  Renal/GU negative Renal ROS     Musculoskeletal negative musculoskeletal ROS (+)   Abdominal   Peds  Hematology negative hematology ROS (+)   Anesthesia Other Findings   Reproductive/Obstetrics negative OB ROS                             Anesthesia Physical Anesthesia Plan  ASA: II  Anesthesia Plan: General   Post-op Pain Management:    Induction: Intravenous  Airway Management Planned: LMA  Additional Equipment:   Intra-op Plan:   Post-operative Plan: Extubation in OR  Informed Consent: I have reviewed the patients History and Physical, chart, labs and discussed the procedure including the risks, benefits and alternatives for the proposed anesthesia with the patient or authorized representative who has indicated his/her understanding and acceptance.   Dental advisory given  Plan Discussed with: CRNA  Anesthesia Plan Comments:         Anesthesia Quick Evaluation

## 2017-01-13 NOTE — Anesthesia Postprocedure Evaluation (Signed)
Anesthesia Post Note  Patient: Shannon Padilla  Procedure(s) Performed: Procedure(s) (LRB): RIGHT BREAST LUMPECTOMY WITH RADIOACTIVE SEED LOCALIZATION (Right)  Patient location during evaluation: PACU Anesthesia Type: General Level of consciousness: sedated and patient cooperative Pain management: pain level controlled Vital Signs Assessment: post-procedure vital signs reviewed and stable Respiratory status: spontaneous breathing Cardiovascular status: stable Anesthetic complications: no       Last Vitals:  Vitals:   01/13/17 0940 01/13/17 1012  BP: 126/72 126/76  Pulse: 91 71  Resp: 15 18  Temp:  36.6 C    Last Pain:  Vitals:   01/13/17 1012  TempSrc: Oral  PainSc: 3                  Lewie LoronJohn Atisha Hamidi

## 2017-01-13 NOTE — Discharge Instructions (Signed)
°Post Anesthesia Home Care Instructions ° °Activity: °Get plenty of rest for the remainder of the day. A responsible adult should stay with you for 24 hours following the procedure.  °For the next 24 hours, DO NOT: °-Drive a car °-Operate machinery °-Drink alcoholic beverages °-Take any medication unless instructed by your physician °-Make any legal decisions or sign important papers. ° °Meals: °Start with liquid foods such as gelatin or soup. Progress to regular foods as tolerated. Avoid greasy, spicy, heavy foods. If nausea and/or vomiting occur, drink only clear liquids until the nausea and/or vomiting subsides. Call your physician if vomiting continues. ° °Special Instructions/Symptoms: °Your throat may feel dry or sore from the anesthesia or the breathing tube placed in your throat during surgery. If this causes discomfort, gargle with warm salt water. The discomfort should disappear within 24 hours. ° °If you had a scopolamine patch placed behind your ear for the management of post- operative nausea and/or vomiting: ° °1. The medication in the patch is effective for 72 hours, after which it should be removed.  Wrap patch in a tissue and discard in the trash. Wash hands thoroughly with soap and water. °2. You may remove the patch earlier than 72 hours if you experience unpleasant side effects which may include dry mouth, dizziness or visual disturbances. °3. Avoid touching the patch. Wash your hands with soap and water after contact with the patch. °  °Call your surgeon if you experience:  ° °1.  Fever over 101.0. °2.  Inability to urinate. °3.  Nausea and/or vomiting. °4.  Extreme swelling or bruising at the surgical site. °5.  Continued bleeding from the incision. °6.  Increased pain, redness or drainage from the incision. °7.  Problems related to your pain medication. °8.  Any problems [facility]or concerns[facility] °Office Phone Number [phone] ° °BREAST BIOPSY/ PARTIAL MASTECTOMY: POST OP  INSTRUCTIONS ° °Always review your discharge instruction sheet given to you by the facility where your surgery was performed. ° °IF YOU HAVE DISABILITY OR FAMILY LEAVE FORMS, YOU MUST BRING THEM TO THE OFFICE FOR PROCESSING.  DO NOT GIVE THEM TO YOUR DOCTOR. ° °1. A prescription for pain medication may be given to you upon discharge.  Take your pain medication as prescribed, if needed.  If narcotic pain medicine is not needed, then you may take acetaminophen (Tylenol) or ibuprofen (Advil) as needed. °2. Take your usually prescribed medications unless otherwise directed °3. If you need a refill on your pain medication, please contact your pharmacy.  They will contact our office to request authorization.  Prescriptions will not be filled after 5pm or on week-ends. °4. You should eat very light the first 24 hours after surgery, such as soup, crackers, pudding, etc.  Resume your normal diet the day after surgery. °5. Most patients will experience some swelling and bruising in the breast.  Ice packs and a good support bra will help.  Swelling and bruising can take several days to resolve.  °6. It is common to experience some constipation if taking pain medication after surgery.  Increasing fluid intake and taking a stool softener will usually help or prevent this problem from occurring.  A mild laxative (Milk of Magnesia or Miralax) should be taken according to package directions if there are no bowel movements after 48 hours. °7. Unless discharge instructions indicate otherwise, you may remove your bandages 24-48 hours after surgery, and you may shower at that time.  You may have steri-strips (small skin tapes) in   place directly over the incision.  These strips should be left on the skin for 7-10 days.  If your surgeon used skin glue on the incision, you may shower in 24 hours.  The glue will flake off over the next 2-3 weeks.  Any sutures or staples will be removed at the office during your follow-up  visit. °8. ACTIVITIES:  You may resume regular daily activities (gradually increasing) beginning the next day.  Wearing a good support bra or sports bra minimizes pain and swelling.  You may have sexual intercourse when it is comfortable. °a. You may drive when you no longer are taking prescription pain medication, you can comfortably wear a seatbelt, and you can safely maneuver your car and apply brakes. °b. RETURN TO WORK:  ______________________________________________________________________________________ °9. You should see your doctor in the office for a follow-up appointment approximately two weeks after your surgery.  Your doctor’s nurse will typically make your follow-up appointment when she calls you with your pathology report.  Expect your pathology report 2-3 business days after your surgery.  You may call to check if you do not hear from us after three days. °10. OTHER INSTRUCTIONS: _______________________________________________________________________________________________ _____________________________________________________________________________________________________________________________________ °_____________________________________________________________________________________________________________________________________ °_____________________________________________________________________________________________________________________________________ ° °WHEN TO CALL YOUR DOCTOR: °1. Fever over 101.0 °2. Nausea and/or vomiting. °3. Extreme swelling or bruising. °4. Continued bleeding from incision. °5. Increased pain, redness, or drainage from the incision. ° °The clinic staff is available to answer your questions during regular business hours.  Please don’t hesitate to call and ask to speak to one of the nurses for clinical concerns.  If you have a medical emergency, go to the nearest emergency room or call 911.  A surgeon from [facility] is always on call at the  hospital. ° °For further questions, please visit [email or web address]  °

## 2017-01-13 NOTE — Anesthesia Procedure Notes (Signed)
Procedure Name: LMA Insertion Date/Time: 01/13/2017 8:11 AM Performed by: Genevieve NorlanderLINKA, Darren Caldron L Pre-anesthesia Checklist: Patient identified, Emergency Drugs available, Suction available, Patient being monitored and Timeout performed Patient Re-evaluated:Patient Re-evaluated prior to inductionOxygen Delivery Method: Circle system utilized Preoxygenation: Pre-oxygenation with 100% oxygen Intubation Type: IV induction Ventilation: Mask ventilation without difficulty LMA: LMA inserted LMA Size: 4.0 Number of attempts: 1 Airway Equipment and Method: Bite block Placement Confirmation: positive ETCO2 Tube secured with: Tape Dental Injury: Teeth and Oropharynx as per pre-operative assessment

## 2017-01-13 NOTE — Op Note (Signed)
Patient Name:           Shannon Padilla   Date of Surgery:        01/13/2017  Pre op Diagnosis:      Complex sclerosing lesion right breast upper outer quadrant  Post op Diagnosis:    Same  Procedure:                 Right breast lumpectomy with radioactive seed localization  Surgeon:                     Edsel Petrin. Dalbert Batman, M.D., FACS  Assistant:                      OR staff   Indication for Assistant: n/a  Operative Indications:    This is a healthy 46 year old female, referred to me by Dr. 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      She has not had any breast problems in the past. Recent screening mammogram showed category C density, focal area of distortion in the right breast upper outer quadrant. No mass or calcifications. Core biopsy shows complex sclerosing lesion. This was felt to be a category 4 mammogram. Excision was recommended by the radiologist.      Family history reveals mother diagnosed with breast cancer age 23. Living and survived. No ovarian cancer.      We had a long talk about complex sclerosing lesion. I told her that the risk of cancer or upgrade to atypia was somewhere between 4 and 9%. Told her she probably did not have cancer. She would like to have this area excised conservatively. I certainly offered that to her and we will plan that. She will be scheduled for right breast lumpectomy with radioactive seed localization. She agrees with this plan.  Operative Findings:       I was able to hear the audible signal of the radioactive seeds using the neoprobe preop in the holding area.  The original biopsy marker clip and the radioactive seed were close to each other in the upper outer quadrant of the right breast.  The specimen mammogram looked good and contained the expected seed and clip.  There is no palpable abnormality.  Procedure in Detail:           Following the induction of general LMA anesthesia the patient's right breast was prepped and draped in sterile fashion.  Surgical timeout was performed.  Intravenous antibiotic given.  0.5% Marcaine with epinephrine was used as local infiltration anesthetic.  The radioactive signal was high and lateral in the upper outer quadrant.  I planned and created a curvilinear skin crease incision high in the upper outer quadrant of the right breast using a knife.  Dissection was carried down into the breast tissue in the lumpectomy was performed with electrocautery and using the neoprobe as a guide.  The specimen was removed and marked with silk sutures and a 6 color ink kit to orient the pathologist.  The specimen mammogram looked good as described above.  The specimen was marked with pins and sent to the lab.  The wound was irrigated.  Hemostasis was excellent.  The lumpectomy cavity was marked with 5 metal clips according to our protocol.  The breast tissues were reapproximated with interrupted 3-0 Vicryl and the skin closed with running subcutaneous taken 4-0 Monocryl  and Dermabond.  Breast binder was placed and the patient taken to PACU in stable condition.  EBL 10 mL.  Counts correct.  Complications none.     Edsel Petrin. Dalbert Batman, M.D., FACS General and Minimally Invasive Surgery Breast and Colorectal Surgery  01/13/2017 8:57 AM

## 2017-01-13 NOTE — Transfer of Care (Signed)
Immediate Anesthesia Transfer of Care Note  Patient: Shannon Padilla  Procedure(s) Performed: Procedure(s): RIGHT BREAST LUMPECTOMY WITH RADIOACTIVE SEED LOCALIZATION (Right)  Patient Location: PACU  Anesthesia Type:General  Level of Consciousness: awake and patient cooperative  Airway & Oxygen Therapy: Patient Spontanous Breathing and Patient connected to face mask oxygen  Post-op Assessment: Report given to RN and Post -op Vital signs reviewed and stable  Post vital signs: Reviewed and stable  Last Vitals:  Vitals:   01/13/17 0731  BP: (!) 112/57  Pulse: (!) 102  Resp: 18  Temp: 36.6 C    Last Pain:  Vitals:   01/13/17 0731  TempSrc: Oral  PainSc: 3          Complications: No apparent anesthesia complications

## 2017-01-14 ENCOUNTER — Encounter (HOSPITAL_BASED_OUTPATIENT_CLINIC_OR_DEPARTMENT_OTHER): Payer: Self-pay | Admitting: General Surgery

## 2017-01-15 NOTE — Progress Notes (Signed)
Inform patient of Pathology report,.  Tell her that her breast lumpectomy showed no evidence of cancer.  Just hyperplasia and complex sclerosing lesion.  This is good news.  No further surgery necessary.  I will discuss with her in detail at her next office visit.  hmi

## 2020-04-17 HISTORY — PX: ANKLE SURGERY: SHX546

## 2021-01-15 ENCOUNTER — Other Ambulatory Visit: Payer: Self-pay | Admitting: Family Medicine

## 2021-01-15 DIAGNOSIS — R1084 Generalized abdominal pain: Secondary | ICD-10-CM

## 2021-01-28 ENCOUNTER — Ambulatory Visit
Admission: RE | Admit: 2021-01-28 | Discharge: 2021-01-28 | Disposition: A | Discharge status: Home / Self Care | Payer: BLUE CROSS/BLUE SHIELD | Source: Ambulatory Visit | Attending: Family Medicine | Admitting: Family Medicine

## 2021-01-28 DIAGNOSIS — R1084 Generalized abdominal pain: Secondary | ICD-10-CM

## 2021-07-07 ENCOUNTER — Encounter: Payer: Self-pay | Admitting: *Deleted

## 2021-07-07 ENCOUNTER — Other Ambulatory Visit: Payer: Self-pay | Admitting: *Deleted

## 2021-07-09 ENCOUNTER — Other Ambulatory Visit: Payer: Self-pay

## 2021-07-09 ENCOUNTER — Encounter: Payer: Self-pay | Admitting: Diagnostic Neuroimaging

## 2021-07-09 ENCOUNTER — Ambulatory Visit (INDEPENDENT_AMBULATORY_CARE_PROVIDER_SITE_OTHER): Payer: 59 | Admitting: Diagnostic Neuroimaging

## 2021-07-09 VITALS — BP 109/66 | HR 75 | Ht 69.5 in | Wt 185.2 lb

## 2021-07-09 DIAGNOSIS — R200 Anesthesia of skin: Secondary | ICD-10-CM | POA: Diagnosis not present

## 2021-07-09 DIAGNOSIS — M79604 Pain in right leg: Secondary | ICD-10-CM | POA: Diagnosis not present

## 2021-07-09 MED ORDER — GABAPENTIN 300 MG PO CAPS: 300.0000 mg | ORAL_CAPSULE | Freq: Every day | ORAL | 2 refills | Status: AC

## 2021-07-09 NOTE — Patient Instructions (Signed)
RIGHT shin / ankle numbness (post-op symptoms after lipoma resection May 2021 and ankle / foot boot x 8 weeks; could represent mild nerve irritation / compression injury of right saphenous nerve) - monitor symptoms; EMG/NCS not likely to change mgmt; may try gabapentin or over the counter topical creams (lidocaine, diclofenac) - follow up with sports medicine / PCP

## 2021-07-09 NOTE — Progress Notes (Deleted)
[facility]  PATIENT: Shannon Padilla DOB: [birth year]  REFERRING CLINICIAN: Christena Deem, MD HISTORY FROM: *** REASON FOR VISIT: ***   HISTORICAL  CHIEF COMPLAINT:  Chief Complaint  Patient presents with   New Patient (Initial Visit)    Rm 7 here for consult on worsening pain/numbness in her right leg since Lipoma removal on 04/17/2020. Pt was rx'd gabapentin was rx'd but did not take due to worrisome side effects.    HISTORY OF PRESENT ILLNESS:  ***  REVIEW OF SYSTEMS: Full 14 system review of systems performed and negative with exception of: ***  ALLERGIES: Allergies  Allergen Reactions   Ampicillin Other (See Comments)    Made feet swell   Tinidazole     Other reaction(s): Unknown   Metronidazole Rash    HOME MEDICATIONS: Outpatient Medications Prior to Visit  Medication Sig Dispense Refill   Calcium Carbonate-Vitamin D (CALCIUM + D PO) Take by mouth. 1 tab daily     Cholecalciferol 50 MCG (2000 UT) CAPS Vitamin D3 50 mcg (2,000 unit) capsule  Take by oral route.     HYDROcodone-acetaminophen (NORCO) 5-325 MG tablet Take 1-2 tablets by mouth every 6 (six) hours as needed for moderate pain or severe pain. 30 tablet 0   sertraline (ZOLOFT) 50 MG tablet 50 mg daily.     clindamycin (CLEOCIN) 300 MG capsule Take 300 mg by mouth 3 (three) times daily. (Patient not taking: Reported on 07/09/2021)     diclofenac (VOLTAREN) 75 MG EC tablet Take 75 mg by mouth 2 (two) times daily. (Patient not taking: Reported on 07/09/2021)     gabapentin (NEURONTIN) 300 MG capsule 300 mg daily. (Patient not taking: Reported on 07/09/2021)     sertraline (ZOLOFT) 50 MG tablet Take 50 mg by mouth daily. (Patient not taking: Reported on 07/09/2021)     No facility-administered medications prior to visit.    PAST MEDICAL HISTORY: Past Medical History:  Diagnosis Date   Abnormal mammogram of right breast 01/13/2017   Anxiety    Breast mass, right    Depression    DJD  (degenerative joint disease) of cervical spine    Headache    History of vitamin D deficiency    Rectal bleeding    Right foot pain    Vitamin D deficiency     PAST SURGICAL HISTORY: Past Surgical History:  Procedure Laterality Date   ANKLE SURGERY Right 04/17/2020   BREAST LUMPECTOMY WITH RADIOACTIVE SEED LOCALIZATION Right 01/13/2017   Procedure: RIGHT BREAST LUMPECTOMY WITH RADIOACTIVE SEED LOCALIZATION;  Surgeon: Claud Kelp, MD;  Location: [facility];  Service: General;  Laterality: Right;   CERVICAL DISC SURGERY  02/12/2016   ENDOMETRIAL ABLATION     TUBAL LIGATION     WISDOM TOOTH EXTRACTION      FAMILY HISTORY: History reviewed. No pertinent family history.  SOCIAL HISTORY: Social History   Socioeconomic History   Marital status: Divorced    Spouse name: Not on file   Number of children: 0   Years of education: Not on file   Highest education level: Not on file  Occupational History   Not on file  Tobacco Use   Smoking status: Former    Types: Cigarettes    Quit date: 02/12/2016    Years since quitting: 5.4   Smokeless tobacco: Never  Substance and Sexual Activity   Alcohol use: Yes    Comment: social   Drug use: No   Sexual activity: Yes  Birth control/protection: Surgical    Comment: ablation, TL  Other Topics Concern   Not on file  Social History Narrative   Right hand    Caffeine- 2-3 cups per day    Lives at home Boyfriend    Social Determinants of Health   Financial Resource Strain: Not on file  Food Insecurity: Not on file  Transportation Needs: Not on file  Physical Activity: Not on file  Stress: Not on file  Social Connections: Not on file  Intimate Partner Violence: Not on file     PHYSICAL EXAM ***  GENERAL EXAM/CONSTITUTIONAL: Vitals:  Vitals:   07/09/21 1118  BP: 109/66  Pulse: 75  Weight: 185 lb 4 oz (84 kg)  Height: 5' 9.5" (1.765 m)   Body mass index is 26.96 kg/m. Wt Readings from Last 3  Encounters:  07/09/21 185 lb 4 oz (84 kg)  01/13/17 176 lb 12.8 oz (80.2 kg)   Patient is in no distress; well developed, nourished and groomed; neck is supple  CARDIOVASCULAR: Examination of carotid arteries is normal; no carotid bruits Regular rate and rhythm, no murmurs Examination of peripheral vascular system by observation and palpation is normal  EYES: Ophthalmoscopic exam of optic discs and posterior segments is normal; no papilledema or hemorrhages No results found.  MUSCULOSKELETAL: Gait, strength, tone, movements noted in Neurologic exam below  NEUROLOGIC: MENTAL STATUS:  No flowsheet data found. awake, alert, oriented to person, place and time recent and remote memory intact normal attention and concentration language fluent, comprehension intact, naming intact fund of knowledge appropriate  CRANIAL NERVE:  2nd - no papilledema on fundoscopic exam 2nd, 3rd, 4th, 6th - pupils equal and reactive to light, visual fields full to confrontation, extraocular muscles intact, no nystagmus 5th - facial sensation symmetric 7th - facial strength symmetric 8th - hearing intact 9th - palate elevates symmetrically, uvula midline 11th - shoulder shrug symmetric 12th - tongue protrusion midline  MOTOR:  normal bulk and tone, full strength in the BUE, BLE  SENSORY:  normal and symmetric to light touch, pinprick, temperature, vibration; EXCEPT DECR IN RIGHT SAPHENOUS NERVE DISTRIBUTION  COORDINATION:  finger-nose-finger, fine finger movements normal  REFLEXES:  deep tendon reflexes present and symmetric  GAIT/STATION:  narrow based gait     DIAGNOSTIC DATA (LABS, IMAGING, TESTING) - I reviewed patient records, labs, notes, testing and imaging myself where available.  Lab Results  Component Value Date   WBC 8.3 12/23/2016   HGB 14.3 12/23/2016   HCT 42.6 12/23/2016   MCV 89.5 12/23/2016   PLT 186 12/23/2016      Component Value Date/Time   NA 139 12/23/2016  1456   K 4.7 12/23/2016 1456   CL 102 12/23/2016 1456   CO2 27 12/23/2016 1456   GLUCOSE 103 (H) 12/23/2016 1456   BUN 16 12/23/2016 1456   CREATININE 0.74 12/23/2016 1456   CALCIUM 10.2 12/23/2016 1456   PROT 7.3 12/23/2016 1456   ALBUMIN 4.3 12/23/2016 1456   AST 20 12/23/2016 1456   ALT 17 12/23/2016 1456   ALKPHOS 62 12/23/2016 1456   BILITOT 0.3 12/23/2016 1456   GFRNONAA >60 12/23/2016 1456   GFRAA >60 12/23/2016 1456   No results found for: CHOL, HDL, LDLCALC, LDLDIRECT, TRIG, CHOLHDL No results found for: AYTK1S No results found for: VITAMINB12 No results found for: TSH     ASSESSMENT AND PLAN  50 y.o. year old female here with ***  Dx:  1. Right leg pain   2.  Right leg numbness       PLAN:  RIGHT shin / ankle numbness (post-op symptoms after lipoma resection May 2021 and ankle / foot boot x 8 weeks; could represent mild nerve irritation / compression injury of right saphenous nerve) - monitor symptoms; EMG/NCS not likely to change mgmt; may try gabapentin or over the counter topical creams (lidocaine, diclofenac) - follow up with sports medicine / PCP  Meds ordered this encounter  Medications   gabapentin (NEURONTIN) 300 MG capsule    Sig: Take 1 capsule (300 mg total) by mouth at bedtime.    Dispense:  30 capsule    Refill:  2   Return for return to PCP, pending if symptoms worsen or fail to improve.    Suanne Marker, MD 07/09/2021, 12:12 PM Certified in Neurology, Neurophysiology and Neuroimaging  [facility] [address], [address] [place], [place] [address] [phone]

## 2021-07-09 NOTE — Progress Notes (Signed)
[facility]  PATIENT: Ja A Milo DOB: 02/01/1971  REFERRING CLINICIAN: Christena Deem, MD HISTORY FROM: patient  REASON FOR VISIT: new consult    HISTORICAL  CHIEF COMPLAINT:  Chief Complaint  Patient presents with   New Patient (Initial Visit)    Rm 7 here for consult on worsening pain/numbness in her right leg since Lipoma removal on 04/17/2020. Pt was rx'd gabapentin was rx'd but did not take due to worrisome side effects.    HISTORY OF PRESENT ILLNESS:   50 year old female here for evaluation of right leg numbness.  Patient had right ankle lipoma resected in May 2021.  She had 6-week postop checkup and for ankle-foot boot for about 8 weeks.  After taking boot off she noticed numbness in her right shin and ankle region.  This is continued since that time.  She tried Voltaren gel without relief.  She had some gabapentin that she tried for a few days which did not help.   REVIEW OF SYSTEMS: Full 14 system review of systems performed and negative with exception of: as per HPI.  ALLERGIES: Allergies  Allergen Reactions   Ampicillin Other (See Comments)    Made feet swell   Tinidazole     Other reaction(s): Unknown   Metronidazole Rash    HOME MEDICATIONS: Outpatient Medications Prior to Visit  Medication Sig Dispense Refill   Calcium Carbonate-Vitamin D (CALCIUM + D PO) Take by mouth. 1 tab daily     Cholecalciferol 50 MCG (2000 UT) CAPS Vitamin D3 50 mcg (2,000 unit) capsule  Take by oral route.     HYDROcodone-acetaminophen (NORCO) 5-325 MG tablet Take 1-2 tablets by mouth every 6 (six) hours as needed for moderate pain or severe pain. 30 tablet 0   sertraline (ZOLOFT) 50 MG tablet 50 mg daily.     clindamycin (CLEOCIN) 300 MG capsule Take 300 mg by mouth 3 (three) times daily. (Patient not taking: Reported on 07/09/2021)     diclofenac (VOLTAREN) 75 MG EC tablet Take 75 mg by mouth 2 (two) times daily. (Patient not taking: Reported on  07/09/2021)     gabapentin (NEURONTIN) 300 MG capsule 300 mg daily. (Patient not taking: Reported on 07/09/2021)     sertraline (ZOLOFT) 50 MG tablet Take 50 mg by mouth daily. (Patient not taking: Reported on 07/09/2021)     No facility-administered medications prior to visit.    PAST MEDICAL HISTORY: Past Medical History:  Diagnosis Date   Abnormal mammogram of right breast 01/13/2017   Anxiety    Breast mass, right    Depression    DJD (degenerative joint disease) of cervical spine    Headache    History of vitamin D deficiency    Rectal bleeding    Right foot pain    Vitamin D deficiency     PAST SURGICAL HISTORY: Past Surgical History:  Procedure Laterality Date   ANKLE SURGERY Right 04/17/2020   BREAST LUMPECTOMY WITH RADIOACTIVE SEED LOCALIZATION Right 01/13/2017   Procedure: RIGHT BREAST LUMPECTOMY WITH RADIOACTIVE SEED LOCALIZATION;  Surgeon: Claud Kelp, MD;  Location: [facility];  Service: General;  Laterality: Right;   CERVICAL DISC SURGERY  02/12/2016   ENDOMETRIAL ABLATION     TUBAL LIGATION     WISDOM TOOTH EXTRACTION      FAMILY HISTORY: History reviewed. No pertinent family history.  SOCIAL HISTORY: Social History   Socioeconomic History   Marital status: Divorced    Spouse name: Not on file  Number of children: 0   Years of education: Not on file   Highest education level: Not on file  Occupational History   Not on file  Tobacco Use   Smoking status: Former    Types: Cigarettes    Quit date: 02/12/2016    Years since quitting: 5.4   Smokeless tobacco: Never  Substance and Sexual Activity   Alcohol use: Yes    Comment: social   Drug use: No   Sexual activity: Yes    Birth control/protection: Surgical    Comment: ablation, TL  Other Topics Concern   Not on file  Social History Narrative   Right hand    Caffeine- 2-3 cups per day    Lives at home Boyfriend    Social Determinants of Health   Financial Resource Strain:  Not on file  Food Insecurity: Not on file  Transportation Needs: Not on file  Physical Activity: Not on file  Stress: Not on file  Social Connections: Not on file  Intimate Partner Violence: Not on file     PHYSICAL EXAM  GENERAL EXAM/CONSTITUTIONAL: Vitals:  Vitals:   07/09/21 1118  BP: 109/66  Pulse: 75  Weight: 185 lb 4 oz (84 kg)  Height: 5' 9.5" (1.765 m)   Body mass index is 26.96 kg/m. Wt Readings from Last 3 Encounters:  07/09/21 185 lb 4 oz (84 kg)  01/13/17 176 lb 12.8 oz (80.2 kg)   Patient is in no distress; well developed, nourished and groomed; neck is supple  CARDIOVASCULAR: Examination of carotid arteries is normal; no carotid bruits Regular rate and rhythm, no murmurs Examination of peripheral vascular system by observation and palpation is normal  EYES: Ophthalmoscopic exam of optic discs and posterior segments is normal; no papilledema or hemorrhages No results found.  MUSCULOSKELETAL: Gait, strength, tone, movements noted in Neurologic exam below  NEUROLOGIC: MENTAL STATUS:  No flowsheet data found. awake, alert, oriented to person, place and time recent and remote memory intact normal attention and concentration language fluent, comprehension intact, naming intact fund of knowledge appropriate  CRANIAL NERVE:  2nd - no papilledema on fundoscopic exam 2nd, 3rd, 4th, 6th - pupils equal and reactive to light, visual fields full to confrontation, extraocular muscles intact, no nystagmus 5th - facial sensation symmetric 7th - facial strength symmetric 8th - hearing intact 9th - palate elevates symmetrically, uvula midline 11th - shoulder shrug symmetric 12th - tongue protrusion midline  MOTOR:  normal bulk and tone, full strength in the BUE, BLE  SENSORY:  normal and symmetric to light touch, pinprick, temperature, vibration; EXCEPT DECR IN RIGHT SAPHENOUS NERVE DISTRIBUTION  COORDINATION:  finger-nose-finger, fine finger movements  normal  REFLEXES:  deep tendon reflexes present and symmetric  GAIT/STATION:  narrow based gait     DIAGNOSTIC DATA (LABS, IMAGING, TESTING) - I reviewed patient records, labs, notes, testing and imaging myself where available.  Lab Results  Component Value Date   WBC 8.3 12/23/2016   HGB 14.3 12/23/2016   HCT 42.6 12/23/2016   MCV 89.5 12/23/2016   PLT 186 12/23/2016      Component Value Date/Time   NA 139 12/23/2016 1456   K 4.7 12/23/2016 1456   CL 102 12/23/2016 1456   CO2 27 12/23/2016 1456   GLUCOSE 103 (H) 12/23/2016 1456   BUN 16 12/23/2016 1456   CREATININE 0.74 12/23/2016 1456   CALCIUM 10.2 12/23/2016 1456   PROT 7.3 12/23/2016 1456   ALBUMIN 4.3 12/23/2016 1456   AST  20 12/23/2016 1456   ALT 17 12/23/2016 1456   ALKPHOS 62 12/23/2016 1456   BILITOT 0.3 12/23/2016 1456   GFRNONAA >60 12/23/2016 1456   GFRAA >60 12/23/2016 1456   No results found for: CHOL, HDL, LDLCALC, LDLDIRECT, TRIG, CHOLHDL No results found for: WYOV7C No results found for: VITAMINB12 No results found for: TSH     ASSESSMENT AND PLAN  50 y.o. year old female here with:  Dx:  1. Right leg pain   2. Right leg numbness       PLAN:  RIGHT shin / ankle numbness (post-op symptoms after lipoma resection May 2021 and ankle / foot boot x 8 weeks; could represent mild nerve irritation / compression injury of right saphenous nerve) - monitor symptoms; EMG/NCS not likely to change mgmt; may try gabapentin or over the counter topical creams (lidocaine, diclofenac) - follow up with sports medicine / PCP  Meds ordered this encounter  Medications   gabapentin (NEURONTIN) 300 MG capsule    Sig: Take 1 capsule (300 mg total) by mouth at bedtime.    Dispense:  30 capsule    Refill:  2   Return for return to PCP, pending if symptoms worsen or fail to improve.    Suanne Marker, MD 07/09/2021, 12:12 PM Certified in Neurology, Neurophysiology and Neuroimaging  [facility] [address] [place], [place] [address] [phone]

## 2022-08-14 ENCOUNTER — Encounter (HOSPITAL_COMMUNITY): Payer: Self-pay

## 2022-08-14 ENCOUNTER — Observation Stay (HOSPITAL_COMMUNITY)
Admission: EM | Admit: 2022-08-14 | Discharge: 2022-08-15 | Disposition: A | Discharge status: Home / Self Care | Payer: Commercial Managed Care - HMO | Attending: Internal Medicine | Admitting: Internal Medicine

## 2022-08-14 ENCOUNTER — Observation Stay (HOSPITAL_COMMUNITY): Payer: Commercial Managed Care - HMO

## 2022-08-14 ENCOUNTER — Other Ambulatory Visit: Payer: Self-pay

## 2022-08-14 ENCOUNTER — Emergency Department (HOSPITAL_COMMUNITY): Payer: Commercial Managed Care - HMO

## 2022-08-14 DIAGNOSIS — L03114 Cellulitis of left upper limb: Secondary | ICD-10-CM | POA: Diagnosis not present

## 2022-08-14 DIAGNOSIS — S51852A Open bite of left forearm, initial encounter: Secondary | ICD-10-CM | POA: Diagnosis present

## 2022-08-14 DIAGNOSIS — S61552A Open bite of left wrist, initial encounter: Secondary | ICD-10-CM | POA: Diagnosis not present

## 2022-08-14 DIAGNOSIS — Z23 Encounter for immunization: Secondary | ICD-10-CM | POA: Insufficient documentation

## 2022-08-14 DIAGNOSIS — F32A Depression, unspecified: Secondary | ICD-10-CM

## 2022-08-14 DIAGNOSIS — W5501XA Bitten by cat, initial encounter: Secondary | ICD-10-CM | POA: Diagnosis not present

## 2022-08-14 DIAGNOSIS — Z87891 Personal history of nicotine dependence: Secondary | ICD-10-CM | POA: Diagnosis not present

## 2022-08-14 DIAGNOSIS — Z79899 Other long term (current) drug therapy: Secondary | ICD-10-CM | POA: Insufficient documentation

## 2022-08-14 DIAGNOSIS — L0390 Cellulitis, unspecified: Secondary | ICD-10-CM | POA: Diagnosis present

## 2022-08-14 LAB — CBC WITH DIFFERENTIAL/PLATELET
Abs Immature Granulocytes: 0.02 10*3/uL (ref 0.00–0.07)
Basophils Absolute: 0 10*3/uL (ref 0.0–0.1)
Basophils Relative: 0 %
Eosinophils Absolute: 0.1 10*3/uL (ref 0.0–0.5)
Eosinophils Relative: 1 %
HCT: 44.9 % (ref 36.0–46.0)
Hemoglobin: 14.8 g/dL (ref 12.0–15.0)
Immature Granulocytes: 0 %
Lymphocytes Relative: 22 %
Lymphs Abs: 1.9 10*3/uL (ref 0.7–4.0)
MCH: 30 pg (ref 26.0–34.0)
MCHC: 33 g/dL (ref 30.0–36.0)
MCV: 90.9 fL (ref 80.0–100.0)
Monocytes Absolute: 0.7 10*3/uL (ref 0.1–1.0)
Monocytes Relative: 8 %
Neutro Abs: 6.2 10*3/uL (ref 1.7–7.7)
Neutrophils Relative %: 69 %
Platelets: 204 10*3/uL (ref 150–400)
RBC: 4.94 MIL/uL (ref 3.87–5.11)
RDW: 12 % (ref 11.5–15.5)
WBC: 8.9 10*3/uL (ref 4.0–10.5)
nRBC: 0 % (ref 0.0–0.2)

## 2022-08-14 LAB — BASIC METABOLIC PANEL
Anion gap: 6 (ref 5–15)
BUN: 18 mg/dL (ref 6–20)
CO2: 26 mmol/L (ref 22–32)
Calcium: 9.5 mg/dL (ref 8.9–10.3)
Chloride: 107 mmol/L (ref 98–111)
Creatinine, Ser: 0.86 mg/dL (ref 0.44–1.00)
GFR, Estimated: >60 mL/min (ref >60)
Glucose, Bld: 98 mg/dL (ref 70–99)
Potassium: 3.9 mmol/L (ref 3.5–5.1)
Sodium: 139 mmol/L (ref 135–145)

## 2022-08-14 LAB — C-REACTIVE PROTEIN: CRP: 0.6 mg/dL (ref <1.0)

## 2022-08-14 LAB — SEDIMENTATION RATE: Sed Rate: 0 mm/hr (ref 0–22)

## 2022-08-14 LAB — LACTIC ACID, PLASMA
Lactic Acid, Venous: 1 mmol/L (ref 0.5–1.9)
Lactic Acid, Venous: 0.9 mmol/L (ref 0.5–1.9)

## 2022-08-14 MED ORDER — MORPHINE SULFATE (PF) 4 MG/ML IV SOLN
4.0000 mg | Freq: Once | INTRAVENOUS | Status: AC
  Administered 2022-08-14: 4 mg via INTRAVENOUS
  Filled 2022-08-14: qty 1

## 2022-08-14 MED ORDER — SODIUM CHLORIDE 0.9 % IV SOLN
3.0000 g | Freq: Four times a day (QID) | INTRAVENOUS | Status: DC
  Administered 2022-08-14 – 2022-08-15 (×4): 3 g via INTRAVENOUS
  Filled 2022-08-14 (×5): qty 8

## 2022-08-14 MED ORDER — TETANUS-DIPHTH-ACELL PERTUSSIS 5-2.5-18.5 LF-MCG/0.5 IM SUSY
0.5000 mL | PREFILLED_SYRINGE | Freq: Once | INTRAMUSCULAR | Status: AC
  Administered 2022-08-14: 0.5 mL via INTRAMUSCULAR
  Filled 2022-08-14: qty 0.5

## 2022-08-14 MED ORDER — HYDROCODONE-ACETAMINOPHEN 5-325 MG PO TABS
1.0000 | ORAL_TABLET | Freq: Once | ORAL | Status: AC
  Administered 2022-08-14: 1 via ORAL
  Filled 2022-08-14: qty 1

## 2022-08-14 MED ORDER — SODIUM CHLORIDE 0.9% FLUSH
3.0000 mL | Freq: Two times a day (BID) | INTRAVENOUS | Status: DC
  Administered 2022-08-14 – 2022-08-15 (×2): 3 mL via INTRAVENOUS

## 2022-08-14 MED ORDER — ACETAMINOPHEN 325 MG PO TABS: 650.0000 mg | ORAL_TABLET | Freq: Four times a day (QID) | ORAL | Status: DC | PRN

## 2022-08-14 MED ORDER — ONDANSETRON HCL 4 MG/2ML IJ SOLN
4.0000 mg | Freq: Once | INTRAMUSCULAR | Status: AC
  Administered 2022-08-14: 4 mg via INTRAVENOUS
  Filled 2022-08-14: qty 2

## 2022-08-14 MED ORDER — ACETAMINOPHEN 650 MG RE SUPP: 650.0000 mg | Freq: Four times a day (QID) | RECTAL | Status: DC | PRN

## 2022-08-14 MED ORDER — GADOBUTROL 1 MMOL/ML IV SOLN
8.5000 mL | Freq: Once | INTRAVENOUS | Status: AC | PRN
  Administered 2022-08-14: 8.5 mL via INTRAVENOUS

## 2022-08-14 MED ORDER — SERTRALINE HCL 50 MG PO TABS
50.0000 mg | ORAL_TABLET | Freq: Every day | ORAL | Status: DC
  Administered 2022-08-15: 50 mg via ORAL
  Filled 2022-08-14: qty 1

## 2022-08-14 MED ORDER — MORPHINE SULFATE (PF) 2 MG/ML IV SOLN
2.0000 mg | INTRAVENOUS | Status: DC | PRN
  Administered 2022-08-15: 2 mg via INTRAVENOUS
  Filled 2022-08-14: qty 1

## 2022-08-14 MED ORDER — PIPERACILLIN-TAZOBACTAM 3.375 G IVPB 30 MIN
3.3750 g | Freq: Once | INTRAVENOUS | Status: AC
  Administered 2022-08-14: 3.375 g via INTRAVENOUS
  Filled 2022-08-14: qty 50

## 2022-08-14 NOTE — ED Provider Notes (Signed)
[facility] DEPT Provider Note   CSN: [id number] Arrival date & time: 08/14/22  1438     History  Chief Complaint  Patient presents with   Animal Bite    Shannon Padilla is a 51 y.o. female.  51 year old female presents today for evaluation of left arm redness and swelling following a cat bite.  Cat bites occurred last evening.  Patient's cat has received all vaccinations to date, but is due for annual vaccinations.  Reports she woke up this morning and did not notice the redness swelling.  Redness and swelling started around 9 AM and has significantly spread since.  She denies fever, chills.  Endorses significant pain with range of motion.  The history is provided by the patient. No language interpreter was used.       Home Medications Prior to Admission medications   Medication Sig Start Date End Date Taking? Authorizing Provider  Calcium Carbonate-Vitamin D (CALCIUM + D PO) Take by mouth. 1 tab daily    [provider]  Cholecalciferol 50 MCG (2000 UT) CAPS Vitamin D3 50 mcg (2,000 unit) capsule  Take by oral route.    [provider]  gabapentin (NEURONTIN) 300 MG capsule Take 1 capsule (300 mg total) by mouth at bedtime. 07/09/21 10/07/21  Penumalli, Earlean Polka, MD  HYDROcodone-acetaminophen (NORCO) 5-325 MG tablet Take 1-2 tablets by mouth every 6 (six) hours as needed for moderate pain or severe pain. 01/13/17   Fanny Skates, MD  sertraline (ZOLOFT) 50 MG tablet 50 mg daily. 11/23/20   [provider]      Allergies    Ampicillin, Tinidazole, and Metronidazole    Review of Systems   Review of Systems  Constitutional:  Negative for chills and fever.  Musculoskeletal:  Positive for arthralgias and joint swelling.  Skin:  Positive for wound.  All other systems reviewed and are negative.   Physical Exam Updated Vital Signs BP 127/81 (BP Location: Right Arm)   Pulse (!) 116   Temp 98.2 F (36.8 C) (Oral)   Resp  18   Ht 5' 9.5" (1.765 m)   Wt 84.8 kg   SpO2 95%   BMI 27.22 kg/m  Physical Exam Vitals and nursing note reviewed.  Constitutional:      General: She is not in acute distress.    Appearance: Normal appearance. She is not ill-appearing.  HENT:     Head: Normocephalic and atraumatic.     Nose: Nose normal.  Eyes:     Conjunctiva/sclera: Conjunctivae normal.  Cardiovascular:     Rate and Rhythm: Normal rate and regular rhythm.     Comments: Initially noted to be tachycardic at 116, on my evaluation patient's heart rate is 85. Pulmonary:     Effort: Pulmonary effort is normal. No respiratory distress.  Musculoskeletal:        General: No deformity.     Comments: Multiple scratch marks, puncture wounds noted to left forearm.  Limited range of motion in the left wrist.  Tenderness to palpation present over radial aspect of left wrist.  2+ DP pulse present.  Full range of motion all digits of the left hand.  Brisk cap refill.   Skin:    Findings: No rash.  Neurological:     Mental Status: She is alert.     ED Results / Procedures / Treatments   Labs (all labs ordered are listed, but only abnormal results are displayed) Labs Reviewed  CBC WITH DIFFERENTIAL/PLATELET  BASIC METABOLIC PANEL  C-REACTIVE PROTEIN  SEDIMENTATION RATE  LACTIC ACID, PLASMA  LACTIC ACID, PLASMA    EKG None  Radiology No results found.  Procedures Procedures    Medications Ordered in ED Medications  Tdap (BOOSTRIX) injection 0.5 mL (has no administration in time range)    ED Course/ Medical Decision Making/ A&P                           Medical Decision Making Amount and/or Complexity of Data Reviewed Labs: ordered. Radiology: ordered.  Risk Prescription drug management.   Medical Decision Making / ED Course   This patient presents to the ED for concern of cat bite to left forearm, this involves an extensive number of treatment options, and is a complaint that carries with it a  high risk of complications and morbidity.  The differential diagnosis includes septic joint, rapidly progressing cellulitis  MDM: 51 year old female presents following cat bite to left forearm.  See attached image for extent of redness, and streaking.  Puncture wound noted over left wrist.  Erythema with streaking noted starting from left wrist particularly over the radial aspect.  Tenderness palpation present over this aspect.  Otherwise neurovascularly intact.  Discussed with Hilbert Odor, PA-C with orthopedist who states someone will be by to evaluate patient.  Agrees with IV antibiotics and admission. Left wrist splint, left wrist MRI ordered by orthopedist for concern of septic joint.  They did not recommend keeping patient n.p.o. at this time.  Will discuss with hospitalist for admission and continued IV antibiotics.  Will provide morphine, Zofran for pain control.  CBC is without leukocytosis.  BMP shows preserved renal function, normal electrolytes.  Patient lactic acid is normal.  ESR CRP pending.   Lab Tests: -I ordered, reviewed, and interpreted labs.   The pertinent results include:   Labs Reviewed  CBC WITH DIFFERENTIAL/PLATELET  BASIC METABOLIC PANEL  C-REACTIVE PROTEIN  SEDIMENTATION RATE  LACTIC ACID, PLASMA  LACTIC ACID, PLASMA      EKG  EKG Interpretation  Date/Time:    Ventricular Rate:    PR Interval:    QRS Duration:   QT Interval:    QTC Calculation:   R Axis:     Text Interpretation:           Imaging Studies ordered: I ordered imaging studies including left wrist I independently visualized and interpreted imaging. I agree with the radiologist interpretation   Medicines ordered and prescription drug management: Meds ordered this encounter  Medications   Tdap (BOOSTRIX) injection 0.5 mL    -I have reviewed the patients home medicines and have made adjustments as needed  Consultations Obtained: I requested consultation with the Hilbert Odor, PA-C with orthopedic group,  and discussed lab and imaging findings as well as pertinent plan - they recommend: Admission for IV antibiotics.  MRI ordered.   Co morbidities that complicate the patient evaluation  Past Medical History:  Diagnosis Date   Abnormal mammogram of right breast 01/13/2017   Anxiety    Breast mass, right    Depression    DJD (degenerative joint disease) of cervical spine    Headache    History of vitamin D deficiency    Rectal bleeding    Right foot pain    Vitamin D deficiency       Dispostion: Discussed with hospitalist who will evaluate patient for admission.   Final Clinical Impression(s) / ED Diagnoses Final diagnoses:  Cat bite, initial encounter  Cellulitis of left upper extremity    Rx / DC Orders ED Discharge Orders     None         Evlyn Courier, PA-C 08/14/22 1659    Isla Pence, MD 08/14/22 1945

## 2022-08-14 NOTE — Progress Notes (Signed)
PHARMACY -  BRIEF ANTIBIOTIC NOTE   Pharmacy has received consult(s) for Zosyn from an ED provider.  The patient's profile has been reviewed for ht/wt/allergies/indication/available labs.    One time order(s) placed for Zosyn 3.375 g  Further antibiotics/pharmacy consults should be ordered by admitting physician if indicated.                       Thank you,  Pricilla Riffle, PharmD, BCPS Clinical Pharmacist 08/14/2022 3:56 PM

## 2022-08-14 NOTE — Progress Notes (Signed)
Orthopedic Tech Progress Note Patient Details:  Shannon Padilla 1971-02-18 [id number]  Ortho Devices Type of Ortho Device: Velcro wrist splint Ortho Device/Splint Location: Left wrist Ortho Device/Splint Interventions: Application   Post Interventions Patient Tolerated: Well  Genelle Bal Johnthomas Lader 08/14/2022, 4:26 PM

## 2022-08-14 NOTE — ED Triage Notes (Signed)
Patient states she was attacked by her cat last night and today is unable to move her left wrist and swelling/redness to the left forearm. Patient states the cat was not up to date on vaccines. Patient is an Personal assistant.  Patient went to [facility] clinic and was sent to the ED for IV antibiotics.

## 2022-08-14 NOTE — H&P (Signed)
History and Physical    Shannon Padilla  [id number]  DOB: 02-23-1971  DOA: 08/14/2022  PCP: Kathyrn Lass, MD Patient coming from: Home  Chief Complaint: cat bite/scratch  HPI:  Shannon Padilla is a 51 yo female with PMH depression who presented to the ER after being bitten and scratched by her cat.  She then began developing redness, swelling, and pain involving her left forearm and wrist.  Her cat is domesticated but lives indoor and outdoor. She was treated with Zosyn in the ER and given a tetanus booster.  Orthopedic surgery was also consulted given wrist swelling and pain with range of motion.  She was recommended to undergo MRI for further evaluation. On work-up she was found to be afebrile but slightly tachycardic, 116. WBC 8.9. She was admitted for further work-up.  I have personally briefly reviewed patient's old medical records in [facility] and discussed patient with the ER provider when appropriate/indicated.  Assessment and Plan: * Cellulitis - Patient scratched and bitten by her cat at home unintentionally after cat became startled.  She has multiple scratches and questionable puncture wounds throughout lower forearm.  She began developing significant pain with wrist movement today; findings on skin consistent with cellulitis - Given Zosyn in the ER - Continue on Unasyn.  Discussed her allergy to ampicillin, she states it was a long time ago with questionable leg swelling.  She is amenable for trial of Unasyn; she also had already tolerated Zosyn when seen -Tetanus booster given in the ER - Cat is domesticated and able to be monitored; therefore no indication for rabies postexposure prophylaxis -Follow-up MRI wrist - Orthopedic surgery following, appreciate assistance - Continue pain control and wrist brace for comfort  Depression - Continue Zoloft    Code Status: Full   DVT Prophylaxis:SCD in case of need for procedure    Anticipated disposition is to:  Home  History: Past Medical History:  Diagnosis Date   Abnormal mammogram of right breast 01/13/2017   Anxiety    Breast mass, right    Depression    DJD (degenerative joint disease) of cervical spine    Headache    History of vitamin D deficiency    Rectal bleeding    Right foot pain    Vitamin D deficiency     Past Surgical History:  Procedure Laterality Date   ANKLE SURGERY Right 04/17/2020   BREAST LUMPECTOMY WITH RADIOACTIVE SEED LOCALIZATION Right 01/13/2017   Procedure: RIGHT BREAST LUMPECTOMY WITH RADIOACTIVE SEED LOCALIZATION;  Surgeon: Fanny Skates, MD;  Location: [place];  Service: General;  Laterality: Right;   CERVICAL DISC SURGERY  02/12/2016   ENDOMETRIAL ABLATION     TUBAL LIGATION     WISDOM TOOTH EXTRACTION       reports that she quit smoking about 6 years ago. Her smoking use included cigarettes. She has never used smokeless tobacco. She reports current alcohol use. She reports that she does not use drugs.  Allergies  Allergen Reactions   Ampicillin Other (See Comments)    Made feet swell   Tinidazole     Other reaction(s): Unknown   Metronidazole Rash    History reviewed. No pertinent family history.  Home Medications: Prior to Admission medications   Medication Sig Start Date End Date Taking? Authorizing Provider  Calcium Carbonate-Vitamin D (CALCIUM + D PO) Take by mouth. 1 tab daily    [provider]  Cholecalciferol 50 MCG (2000 UT) CAPS Vitamin D3 50 mcg (  2,000 unit) capsule  Take by oral route.    [provider]  gabapentin (NEURONTIN) 300 MG capsule Take 1 capsule (300 mg total) by mouth at bedtime. 07/09/21 10/07/21  Penumalli, Glenford Bayley, MD  HYDROcodone-acetaminophen (NORCO) 5-325 MG tablet Take 1-2 tablets by mouth every 6 (six) hours as needed for moderate pain or severe pain. 01/13/17   Claud Kelp, MD  sertraline (ZOLOFT) 50 MG tablet 50 mg daily. 11/23/20   [provider]    Review  of Systems:  Review of Systems  Constitutional:  Negative for chills, fever and malaise/fatigue.  HENT: Negative.    Eyes: Negative.   Respiratory: Negative.    Cardiovascular: Negative.   Gastrointestinal: Negative.   Genitourinary: Negative.   Musculoskeletal:  Positive for joint pain.  Skin: Negative.   Neurological: Negative.   Endo/Heme/Allergies: Negative.   Psychiatric/Behavioral: Negative.      Physical Exam:  Vitals:   08/14/22 1453 08/14/22 1504 08/14/22 1845 08/14/22 1900  BP: 127/81  109/74 106/73  Pulse: (!) 116  77 80  Resp: 18  18   Temp: 98.2 F (36.8 C)  98.5 F (36.9 C)   TempSrc: Oral     SpO2: 95%  93% 97%  Weight:  84.8 kg    Height:  5' 9.5" (1.765 m)     Physical Exam Constitutional:      General: She is not in acute distress.    Appearance: She is not ill-appearing.     Comments: Uncomfortable appearing with pain in left wrist  HENT:     Mouth/Throat:     Mouth: Mucous membranes are moist.  Eyes:     Extraocular Movements: Extraocular movements intact.  Cardiovascular:     Rate and Rhythm: Normal rate and regular rhythm.  Pulmonary:     Effort: Pulmonary effort is normal.     Breath sounds: Normal breath sounds.  Abdominal:     General: Bowel sounds are normal. There is no distension.     Palpations: Abdomen is soft.     Tenderness: There is no abdominal tenderness.  Musculoskeletal:     Cervical back: Normal range of motion and neck supple.     Comments: Left forearm noted with various scratches and patches of erythema in distal forearm; painful active/passive ROB in left wrist with even minor movement. Minimal edema appreciated. Decreased grip strength in hand due to pain   Skin:    Comments: Patchy erythema noted in distal left forearm  Neurological:     General: No focal deficit present.     Mental Status: She is alert.  Psychiatric:        Mood and Affect: Mood normal.      Labs on Admission:  I have personally reviewed  following labs and imaging studies Results for orders placed or performed during the hospital encounter of 08/14/22 (from the past 24 hour(s))  CBC with Differential     Status: None   Collection Time: 08/14/22  3:25 PM  Result Value Ref Range   WBC 8.9 4.0 - 10.5 K/uL   RBC 4.94 3.87 - 5.11 MIL/uL   Hemoglobin 14.8 12.0 - 15.0 g/dL   HCT 01.7 51.0 - 25.8 %   MCV 90.9 80.0 - 100.0 fL   MCH 30.0 26.0 - 34.0 pg   MCHC 33.0 30.0 - 36.0 g/dL   RDW 52.7 78.2 - 42.3 %   Platelets 204 150 - 400 K/uL   nRBC 0.0 0.0 - 0.2 %  Neutrophils Relative % 69 %   Neutro Abs 6.2 1.7 - 7.7 K/uL   Lymphocytes Relative 22 %   Lymphs Abs 1.9 0.7 - 4.0 K/uL   Monocytes Relative 8 %   Monocytes Absolute 0.7 0.1 - 1.0 K/uL   Eosinophils Relative 1 %   Eosinophils Absolute 0.1 0.0 - 0.5 K/uL   Basophils Relative 0 %   Basophils Absolute 0.0 0.0 - 0.1 K/uL   Immature Granulocytes 0 %   Abs Immature Granulocytes 0.02 0.00 - 0.07 K/uL  Basic metabolic panel     Status: None   Collection Time: 08/14/22  3:25 PM  Result Value Ref Range   Sodium 139 135 - 145 mmol/L   Potassium 3.9 3.5 - 5.1 mmol/L   Chloride 107 98 - 111 mmol/L   CO2 26 22 - 32 mmol/L   Glucose, Bld 98 70 - 99 mg/dL   BUN 18 6 - 20 mg/dL   Creatinine, Ser 7.61 0.44 - 1.00 mg/dL   Calcium 9.5 8.9 - 95.0 mg/dL   GFR, Estimated >93 >26 mL/min   Anion gap 6 5 - 15  Sedimentation rate     Status: None   Collection Time: 08/14/22  3:25 PM  Result Value Ref Range   Sed Rate 0 0 - 22 mm/hr  Lactic acid, plasma     Status: None   Collection Time: 08/14/22  3:27 PM  Result Value Ref Range   Lactic Acid, Venous 1.0 0.5 - 1.9 mmol/L  Lactic acid, plasma     Status: None   Collection Time: 08/14/22  5:36 PM  Result Value Ref Range   Lactic Acid, Venous 0.9 0.5 - 1.9 mmol/L     Radiological Exams on Admission: DG Wrist Complete Left  Result Date: 08/14/2022 CLINICAL DATA:  Pain and swelling EXAM: LEFT WRIST - COMPLETE 3+ VIEW COMPARISON:   None Available. FINDINGS: No fracture or malalignment. Soft tissue swelling. Joint spaces appear patent. IMPRESSION: Soft tissue swelling.  No acute osseous abnormality Electronically Signed   By: Jasmine Pang M.D.   On: 08/14/2022 16:06   DG Wrist Complete Left  Final Result    MR WRIST LEFT W WO CONTRAST    (Results Pending)    Consults called:  Orthopedic surgery   Lewie Chamber, MD [facility] 08/14/2022, 7:24 PM

## 2022-08-14 NOTE — Hospital Course (Signed)
Shannon Padilla is a 51 yo female with PMH depression who presented to the ER after being bitten and scratched by her cat.  She then began developing redness, swelling, and pain involving her left forearm and wrist.  Her cat is domesticated but lives indoor and outdoor. She was treated with Zosyn in the ER and given a tetanus booster.  Orthopedic surgery was also consulted given wrist swelling and pain with range of motion.  She was recommended to undergo MRI for further evaluation. On work-up she was found to be afebrile but slightly tachycardic, 116. WBC 8.9. She was admitted for further work-up. MRI left wrist obtained after admission.  This showed cellulitis of the left wrist with mild tenosynovitis of the second extensor compartment.  No obvious abscess or osteomyelitis/septic arthritis appreciated. She was transitioned to Augmentin at discharge to complete course.

## 2022-08-14 NOTE — Consult Note (Signed)
Reason for Consult:Cat bite Referring Physician: Jacalyn Lefevre Time called: 1545 Time at bedside: 1601   Shannon Padilla is an 51 y.o. female.  HPI: Shannon Padilla was bitten by her cat last night. The cat has been vaccinated but is behind on its shots. This morning she woke up with wrist pain and by late morning it was severe and she had also developed redness. She was brought to the ED and hand surgery was consulted. 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  Past Medical History:  Diagnosis Date   Abnormal mammogram of right breast 01/13/2017   Anxiety    Breast mass, right    Depression    DJD (degenerative joint disease) of cervical spine    Headache    History of vitamin D deficiency    Rectal bleeding    Right foot pain    Vitamin D deficiency     Past Surgical History:  Procedure Laterality Date   ANKLE SURGERY Right 04/17/2020   BREAST LUMPECTOMY WITH RADIOACTIVE SEED LOCALIZATION Right 01/13/2017   Procedure: RIGHT BREAST LUMPECTOMY WITH RADIOACTIVE SEED LOCALIZATION;  Surgeon: Claud Kelp, MD;  Location: [facility];  Service: General;  Laterality: Right;   CERVICAL DISC SURGERY  02/12/2016   ENDOMETRIAL ABLATION     TUBAL LIGATION     WISDOM TOOTH EXTRACTION      History reviewed. No pertinent family history.  Social History:  reports that she quit smoking about 6 years ago. Her smoking use included cigarettes. She has never used smokeless tobacco. She reports current alcohol use. She reports that she does not use drugs.  Allergies:  Allergies  Allergen Reactions   Ampicillin Other (See Comments)    Made feet swell   Tinidazole     Other reaction(s): Unknown   Metronidazole Rash    Medications: I have reviewed the patient's current medications.  No results found for this or any previous visit (from the past 48 hour(s)).  No results found.  Review of Systems  Constitutional:  Negative for chills, diaphoresis and fever.  HENT:  Negative for ear  discharge, ear pain, hearing loss and tinnitus.   Eyes:  Negative for photophobia and pain.  Respiratory:  Negative for cough and shortness of breath.   Cardiovascular:  Negative for chest pain.  Gastrointestinal:  Negative for abdominal pain, nausea and vomiting.  Genitourinary:  Negative for dysuria, flank pain, frequency and urgency.  Musculoskeletal:  Positive for arthralgias (Left wrist>hand>FA). Negative for back pain, myalgias and neck pain.  Neurological:  Negative for dizziness and headaches.  Hematological:  Does not bruise/bleed easily.  Psychiatric/Behavioral:  The patient is not nervous/anxious.    Blood pressure 127/81, pulse (!) 116, temperature 98.2 F (36.8 C), temperature source Oral, resp. rate 18, height 5' 9.5" (1.765 m), weight 84.8 kg, SpO2 95 %. Physical Exam Constitutional:      General: She is not in acute distress.    Appearance: She is well-developed. She is not diaphoretic.  HENT:     Head: Normocephalic and atraumatic.  Eyes:     General: No scleral icterus.       Right eye: No discharge.        Left eye: No discharge.     Conjunctiva/sclera: Conjunctivae normal.  Cardiovascular:     Rate and Rhythm: Normal rate and regular rhythm.  Pulmonary:     Effort: Pulmonary effort is normal. No respiratory distress.  Musculoskeletal:     Cervical back: Normal range of  motion.     Comments: Left shoulder, elbow, wrist, digits- Multiple puncture wounds and scratches distal FA, wrist,and hand, severe TTP wrist, ~20 degrees AROM wrist, streaking erythema volar FA, patchy erythema dorsal wrist/FA, no instability, no blocks to motion  Sens  Ax/R/M/U intact  Mot   Ax/ R/ PIN/ M/ AIN/ U intact  Rad 2+  Skin:    General: Skin is warm and dry.  Neurological:     Mental Status: She is alert.  Psychiatric:        Mood and Affect: Mood normal.        Behavior: Behavior normal.     Assessment/Plan: Cat bites -- Will need medical admission for IV abx. Will splint  for comfort and get MRI wrist. Dr. Dion Saucier to f/u once MRI obtained.    Freeman Caldron, PA-C Orthopedic Surgery [phone] 08/14/2022, 4:06 PM

## 2022-08-14 NOTE — Assessment & Plan Note (Signed)
Continue Zoloft 

## 2022-08-14 NOTE — Assessment & Plan Note (Signed)
-   Patient scratched and bitten by her cat at home unintentionally after cat became startled.  She has multiple scratches and questionable puncture wounds throughout lower forearm.  She began developing significant pain with wrist movement today; findings on skin consistent with cellulitis - Given Zosyn in the ER - Continue on Unasyn.  Discussed her allergy to ampicillin, she states it was a long time ago with questionable leg swelling.  She is amenable for trial of Unasyn; she also had already tolerated Zosyn when seen -Tetanus booster given in the ER - Cat is domesticated and able to be monitored; therefore no indication for rabies postexposure prophylaxis -Follow-up MRI wrist - Orthopedic surgery following, appreciate assistance - Continue pain control and wrist brace for comfort

## 2022-08-15 ENCOUNTER — Encounter: Payer: Self-pay | Admitting: Internal Medicine

## 2022-08-15 DIAGNOSIS — L03114 Cellulitis of left upper limb: Secondary | ICD-10-CM | POA: Diagnosis not present

## 2022-08-15 LAB — CBC WITH DIFFERENTIAL/PLATELET
Abs Immature Granulocytes: 0.03 10*3/uL (ref 0.00–0.07)
Basophils Absolute: 0 10*3/uL (ref 0.0–0.1)
Basophils Relative: 1 %
Eosinophils Absolute: 0.1 10*3/uL (ref 0.0–0.5)
Eosinophils Relative: 1 %
HCT: 40.4 % (ref 36.0–46.0)
Hemoglobin: 13.6 g/dL (ref 12.0–15.0)
Immature Granulocytes: 0 %
Lymphocytes Relative: 35 %
Lymphs Abs: 2.8 10*3/uL (ref 0.7–4.0)
MCH: 30.5 pg (ref 26.0–34.0)
MCHC: 33.7 g/dL (ref 30.0–36.0)
MCV: 90.6 fL (ref 80.0–100.0)
Monocytes Absolute: 0.7 10*3/uL (ref 0.1–1.0)
Monocytes Relative: 9 %
Neutro Abs: 4.5 10*3/uL (ref 1.7–7.7)
Neutrophils Relative %: 54 %
Platelets: 183 10*3/uL (ref 150–400)
RBC: 4.46 MIL/uL (ref 3.87–5.11)
RDW: 12 % (ref 11.5–15.5)
WBC: 8.2 10*3/uL (ref 4.0–10.5)
nRBC: 0 % (ref 0.0–0.2)

## 2022-08-15 LAB — BASIC METABOLIC PANEL
Anion gap: 5 (ref 5–15)
BUN: 16 mg/dL (ref 6–20)
CO2: 28 mmol/L (ref 22–32)
Calcium: 9.1 mg/dL (ref 8.9–10.3)
Chloride: 107 mmol/L (ref 98–111)
Creatinine, Ser: 0.71 mg/dL (ref 0.44–1.00)
GFR, Estimated: >60 mL/min (ref >60)
Glucose, Bld: 113 mg/dL — ABNORMAL HIGH (ref 70–99)
Potassium: 4.2 mmol/L (ref 3.5–5.1)
Sodium: 140 mmol/L (ref 135–145)

## 2022-08-15 LAB — MAGNESIUM: Magnesium: 1.9 mg/dL (ref 1.7–2.4)

## 2022-08-15 MED ORDER — ACETAMINOPHEN 325 MG PO TABS
650.0000 mg | ORAL_TABLET | Freq: Four times a day (QID) | ORAL | Status: DC
  Administered 2022-08-15: 650 mg via ORAL
  Filled 2022-08-15: qty 2

## 2022-08-15 MED ORDER — AMOXICILLIN-POT CLAVULANATE 875-125 MG PO TABS: 1.0000 | ORAL_TABLET | Freq: Two times a day (BID) | ORAL | 0 refills | Status: AC

## 2022-08-15 MED ORDER — OXYCODONE HCL 5 MG PO TABS: 5.0000 mg | ORAL_TABLET | ORAL | Status: DC | PRN

## 2022-08-15 MED ORDER — OXYCODONE HCL 5 MG PO TABS
5.0000 mg | ORAL_TABLET | ORAL | 0 refills | Status: AC | PRN
Start: 2022-08-15 — End: ?

## 2022-08-15 NOTE — Discharge Summary (Signed)
Physician Discharge Summary   Shannon Padilla BJS:[id number] DOB: 02/10/1971 DOA: 08/14/2022  PCP: Sigmund Hazel, MD  Admit date: 08/14/2022 Discharge date: 08/15/2022 Barriers to discharge: none  Admitted From: Home Disposition:  Home Discharging physician: Lewie Chamber, MD  Recommendations for Outpatient Follow-up:  Patient given return instructions for any worsening regarding her wrist  Home Health:  Equipment/Devices:   Discharge Condition: stable CODE STATUS: Full Diet recommendation:  Diet Orders (From admission, onward)     Start     Ordered   08/15/22 0842  Diet regular Room service appropriate? Yes; Fluid consistency: Thin  Diet effective now       Question Answer Comment  Room service appropriate? Yes   Fluid consistency: Thin      08/15/22 0841   08/15/22 0000  Diet general        08/15/22 1350            Hospital Course: Shannon Padilla is a 51 yo female with PMH depression who presented to the ER after being bitten and scratched by her cat.  She then began developing redness, swelling, and pain involving her left forearm and wrist.  Her cat is domesticated but lives indoor and outdoor. She was treated with Zosyn in the ER and given a tetanus booster.  Orthopedic surgery was also consulted given wrist swelling and pain with range of motion.  She was recommended to undergo MRI for further evaluation. On work-up she was found to be afebrile but slightly tachycardic, 116. WBC 8.9. She was admitted for further work-up. MRI left wrist obtained after admission.  This showed cellulitis of the left wrist with mild tenosynovitis of the second extensor compartment.  No obvious abscess or osteomyelitis/septic arthritis appreciated. She was transitioned to Augmentin at discharge to complete course.  Assessment and Plan: * Cellulitis - Patient scratched and bitten by her cat at home unintentionally after cat became startled.  She has multiple scratches and questionable puncture  wounds throughout lower forearm.  She began developing significant pain with wrist movement on 9/1; findings on skin consistent with cellulitis - Given Zosyn in the ER - Continued on Unasyn.  Discussed her allergy to ampicillin, she states it was a long time ago with questionable leg swelling.  She is amenable for trial of Unasyn; she also had already tolerated Zosyn when seen; she did tolerate Unasyn well during hospitalization -Tetanus booster given in the ER - Cat is domesticated and able to be monitored; therefore no indication for rabies postexposure prophylaxis -MRI shows cellulitis and tenosynovitis.  No abscess nor osteomyelitis nor septic arthritis - Orthopedic surgery following, appreciate assistance - Continue pain control and wrist brace for comfort -Transitioned to Augmentin at discharge to complete course  Depression - Continue Zoloft    The patient's chronic medical conditions were treated accordingly per the patient's home medication regimen except as noted.  On day of discharge, patient was felt deemed stable for discharge. Patient/family member advised to call PCP or come back to ER if needed.   Principal Diagnosis: Cellulitis  Discharge Diagnoses: Active Hospital Problems   Diagnosis Date Noted   Cellulitis 08/14/2022    Priority: 1.   Depression     Resolved Hospital Problems  No resolved problems to display.     Discharge Instructions     Diet general   Complete by: As directed    Increase activity slowly   Complete by: As directed       Allergies as of 08/15/2022  Reactions   Ampicillin Other (See Comments)   Made feet swell   Tinidazole    Other reaction(s): Unknown   Metronidazole Rash        Medication List     STOP taking these medications    HYDROcodone-acetaminophen 5-325 MG tablet Commonly known as: Norco       TAKE these medications    amoxicillin-clavulanate 875-125 MG tablet Commonly known as: AUGMENTIN Take 1 tablet  by mouth 2 (two) times daily for 10 days.   gabapentin 300 MG capsule Commonly known as: NEURONTIN Take 1 capsule (300 mg total) by mouth at bedtime.   oxyCODONE 5 MG immediate release tablet Commonly known as: Oxy IR/ROXICODONE Take 1 tablet (5 mg total) by mouth every 4 (four) hours as needed for severe pain.   sertraline 50 MG tablet Commonly known as: ZOLOFT Take 50 mg by mouth daily.        Allergies  Allergen Reactions   Ampicillin Other (See Comments)    Made feet swell   Tinidazole     Other reaction(s): Unknown   Metronidazole Rash    Consultations: Orthopedic surgery  Procedures:   Discharge Exam: BP 112/60 (BP Location: Right Arm)   Pulse 73   Temp 98.3 F (36.8 C)   Resp 18   Ht 5' 9.5" (1.765 m)   Wt 84.8 kg   SpO2 98%   BMI 27.22 kg/m  Physical Exam Constitutional:      General: She is not in acute distress.    Appearance: Normal appearance. She is not ill-appearing.  HENT:     Head: Normocephalic and atraumatic.     Mouth/Throat:     Mouth: Mucous membranes are moist.  Eyes:     Extraocular Movements: Extraocular movements intact.  Cardiovascular:     Rate and Rhythm: Normal rate and regular rhythm.  Pulmonary:     Effort: Pulmonary effort is normal.     Breath sounds: Normal breath sounds.  Abdominal:     General: Bowel sounds are normal. There is no distension.     Palpations: Abdomen is soft.     Tenderness: There is no abdominal tenderness.  Musculoskeletal:     Cervical back: Normal range of motion and neck supple.     Comments: Improved erythema involving distal left forearm.  Improved range of motion but still painful.  Mild swelling in distal left forearm as well.  Skin:    General: Skin is warm and dry.  Neurological:     General: No focal deficit present.     Mental Status: She is alert.  Psychiatric:        Mood and Affect: Mood normal.    Pic taken 9/2 prior to discharge:      The results of significant  diagnostics from this hospitalization (including imaging, microbiology, ancillary and laboratory) are listed below for reference.   Microbiology: No results found for this or any previous visit (from the past 240 hour(s)).   Labs: BNP (last 3 results) No results for input(s): "BNP" in the last 8760 hours. Basic Metabolic Panel: Recent Labs  Lab 08/14/22 1525 08/15/22 0337  NA 139 140  K 3.9 4.2  CL 107 107  CO2 26 28  GLUCOSE 98 113*  BUN 18 16  CREATININE 0.86 0.71  CALCIUM 9.5 9.1  MG  --  1.9   Liver Function Tests: No results for input(s): "AST", "ALT", "ALKPHOS", "BILITOT", "PROT", "ALBUMIN" in the last 168 hours. No results for input(s): "  LIPASE", "AMYLASE" in the last 168 hours. No results for input(s): "AMMONIA" in the last 168 hours. CBC: Recent Labs  Lab 08/14/22 1525 08/15/22 0337  WBC 8.9 8.2  NEUTROABS 6.2 4.5  HGB 14.8 13.6  HCT 44.9 40.4  MCV 90.9 90.6  PLT 204 183   Cardiac Enzymes: No results for input(s): "CKTOTAL", "CKMB", "CKMBINDEX", "TROPONINI" in the last 168 hours. BNP: Invalid input(s): "POCBNP" CBG: No results for input(s): "GLUCAP" in the last 168 hours. D-Dimer No results for input(s): "DDIMER" in the last 72 hours. Hgb A1c No results for input(s): "HGBA1C" in the last 72 hours. Lipid Profile No results for input(s): "CHOL", "HDL", "LDLCALC", "TRIG", "CHOLHDL", "LDLDIRECT" in the last 72 hours. Thyroid function studies No results for input(s): "TSH", "T4TOTAL", "T3FREE", "THYROIDAB" in the last 72 hours.  Invalid input(s): "FREET3" Anemia work up No results for input(s): "VITAMINB12", "FOLATE", "FERRITIN", "TIBC", "IRON", "RETICCTPCT" in the last 72 hours. Urinalysis    Component Value Date/Time   COLORURINE YELLOW 04/05/2011 1930   APPEARANCEUR CLEAR 04/05/2011 1930   LABSPEC 1.009 04/05/2011 1930   PHURINE 7.0 04/05/2011 1930   GLUCOSEU NEGATIVE 04/05/2011 1930   HGBUR NEGATIVE 04/05/2011 1930   BILIRUBINUR NEGATIVE  04/05/2011 1930   KETONESUR NEGATIVE 04/05/2011 1930   PROTEINUR NEGATIVE 04/05/2011 1930   UROBILINOGEN 0.2 04/05/2011 1930   NITRITE NEGATIVE 04/05/2011 1930   LEUKOCYTESUR  04/05/2011 1930    NEGATIVE MICROSCOPIC NOT DONE ON URINES WITH NEGATIVE PROTEIN, BLOOD, LEUKOCYTES, NITRITE, OR GLUCOSE <1000 mg/dL.   Sepsis Labs Recent Labs  Lab 08/14/22 1525 08/15/22 0337  WBC 8.9 8.2   Microbiology No results found for this or any previous visit (from the past 240 hour(s)).  Procedures/Studies: MR WRIST LEFT W WO CONTRAST  Result Date: 08/14/2022 CLINICAL DATA:  Septic arthritis suspected, wrist, xray done EXAM: MR OF THE LEFT WRIST WITHOUT AND WITH CONTRAST TECHNIQUE: Multiplanar multisequence MR imaging of the left wrist was performed both before and after the administration of intravenous contrast. CONTRAST:  8.9mL GADAVIST GADOBUTROL 1 MMOL/ML IV SOLN COMPARISON:  Left wrist radiograph 08/14/2022. FINDINGS: Ligaments: Scapholunate and lunotriquetral ligaments are intact. Triangular fibrocartilage: Intact Tendons: There is mild tenosynovitis of the second extensor compartment tendons with tenosynovial fluid and mild enhancement. There is a subsheath tear with subluxation of the ECU tendon, and no significant edema signal suggesting this is chronic. Carpal tunnel/median nerve: Flexor retinaculum is intact. Normal carpal tunnel without a mass. Median nerve demonstrates normal signal and caliber. Guyon's canal: Normal Guyon's canal. Normal ulnar nerve. Joint/cartilage: No chondral defect. No joint effusion. Bones/carpal alignment: No evidence of acute fracture or avascular necrosis. Alignment is normal. Other: There is soft tissue swelling along the dorsal, volar, and lateral wrist with soft tissue enhancement compatible with cellulitis. There is no well-defined/drainable fluid collection. IMPRESSION: Cellulitis of the left wrist with mild tenosynovitis of the second extensor compartment. No  well-defined/drainable abscess. No evidence of osteomyelitis or septic arthritis. Electronically Signed   By: Maurine Simmering M.D.   On: 08/14/2022 21:56   DG Wrist Complete Left  Result Date: 08/14/2022 CLINICAL DATA:  Pain and swelling EXAM: LEFT WRIST - COMPLETE 3+ VIEW COMPARISON:  None Available. FINDINGS: No fracture or malalignment. Soft tissue swelling. Joint spaces appear patent. IMPRESSION: Soft tissue swelling.  No acute osseous abnormality Electronically Signed   By: Donavan Foil M.D.   On: 08/14/2022 16:06     Time coordinating discharge: Over 30 minutes    Dwyane Dee, MD  [facility]  08/15/2022, 1:50 PM

## 2022-08-15 NOTE — TOC CM/SW Note (Signed)
  Transition of Care (TOC) Screening Note   Patient Details  Name: MADGELINE RAYO Date of Birth: Sep 03, 1971   Transition of Care [facility]) CM/SW Contact:    Darleene Cleaver, LCSW Phone Number: 08/15/2022, 2:35 PM    Transition of Care Department [facility]) has reviewed patient and no TOC needs have been identified at this time. We will continue to monitor patient advancement through interdisciplinary progression rounds. If new patient transition needs arise, please place a TOC consult.

## 2022-08-15 NOTE — Discharge Instructions (Signed)
If you are unable to bear enough weight for returning to work after 1 week, then please follow up with primary care or present to urgent care for re-evaluation.  If any worsening of pain, redness, swelling, or pus draining then call or present back to hospital for further evaluation as this may be a sign of worsening infection.

## 2022-08-15 NOTE — Plan of Care (Signed)
  Problem: Education: Goal: Knowledge of General Education information will improve Description: Including pain rating scale, medication(s)/side effects and non-pharmacologic comfort measures Outcome: Progressing   Problem: Pain Managment: Goal: General experience of comfort will improve Outcome: Progressing   

## 2022-08-15 NOTE — Progress Notes (Signed)
Pt alert and oriented. No questions or queries regarding discharge instructions. Pt belongings sent home with pt.

## 2022-08-15 NOTE — Progress Notes (Signed)
Patient seen and examined.  Cellulitis significantly improved.  She is using her hand functionally for lifting and answering the phone and drinking and eating.  Sensation intact throughout the hand, she has multiple abrasions and puncture wounds, all of them appear to be improved compared to pictures from yesterday.  Impression cat bite with multiple puncture wounds and lacerations left upper extremity  Plan doing well overall, she can continue with antibiotics and is okay for discharge from my standpoint.  I have provided her with the information for follow-up with me in approximately 1 week, she may or may not elect to pursue the follow-up depending on how she is progressing clinically.  Teryl Lucy, MD

## 2022-09-07 IMAGING — US US ABDOMEN COMPLETE
1 series · 14 of 25 positions shown · non-contrast
Comparison: None

CLINICAL DATA: Generalized abdominal pain, RIGHT upper quadrant
tenderness to palpation

EXAM:
ABDOMEN ULTRASOUND COMPLETE

[Series 1: us abdomen complete · 0.20mm/px · 14 of 92 slices shown]
[im 1/92]
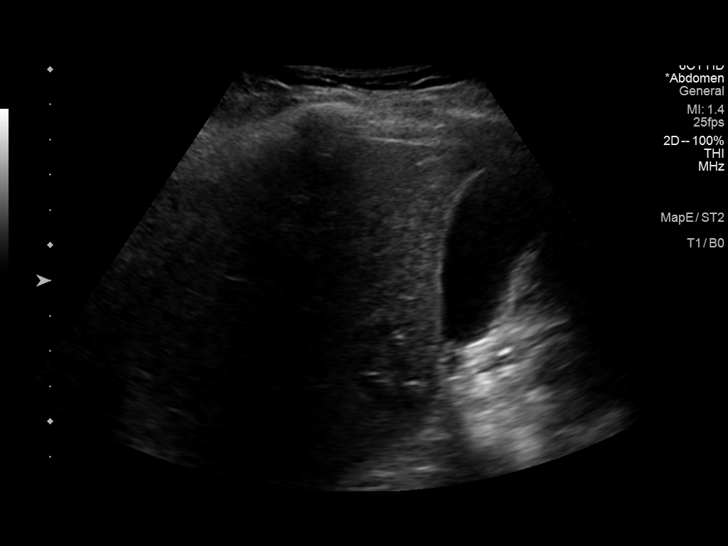
[im 8/92]
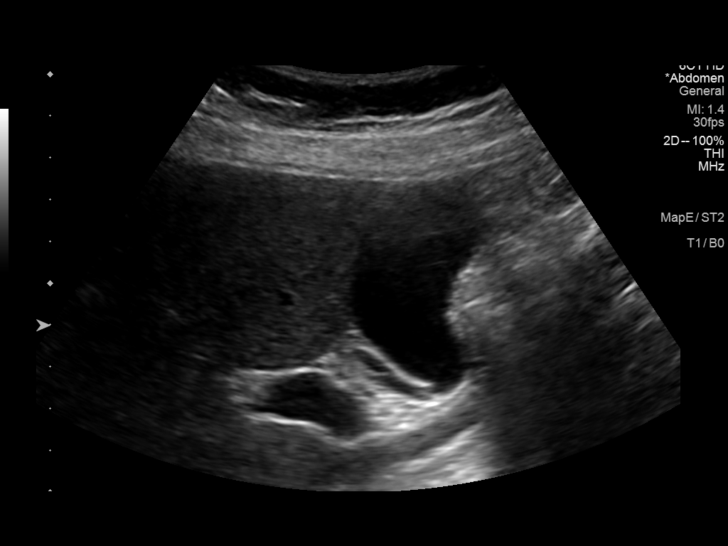
[im 16/92]
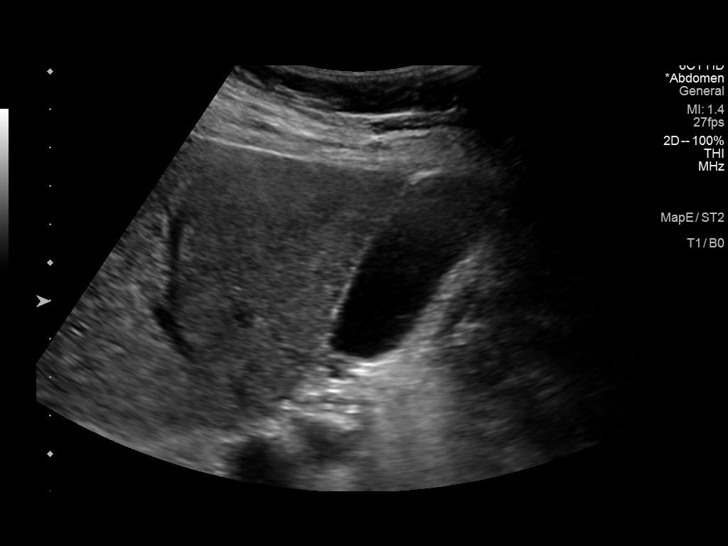
[im 23/92]
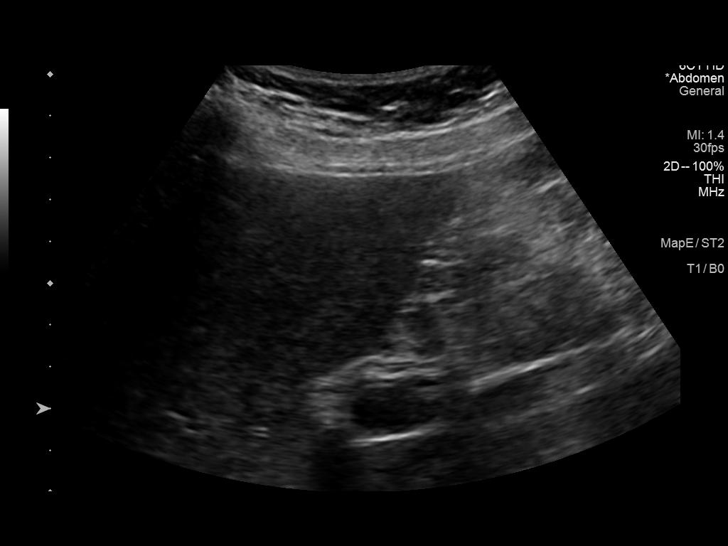
[im 31/92]
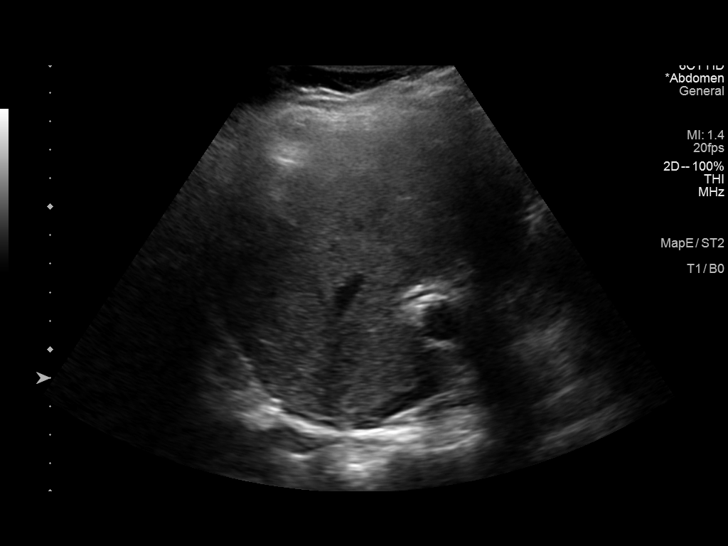
[im 35/92]
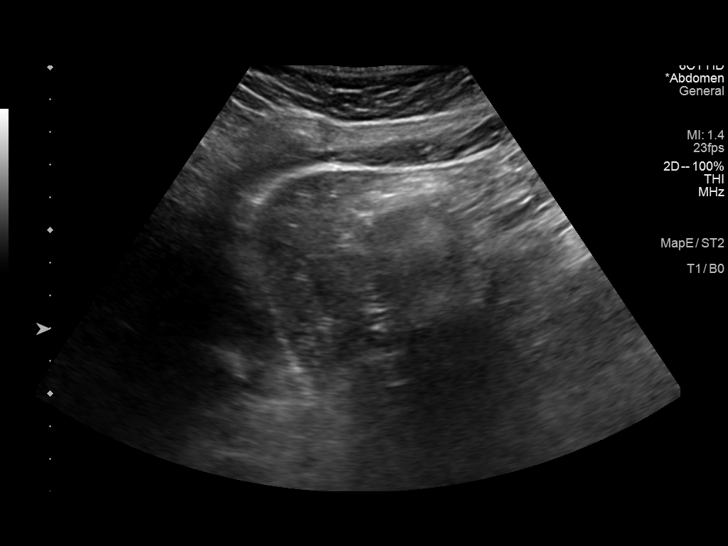
[im 42/92]
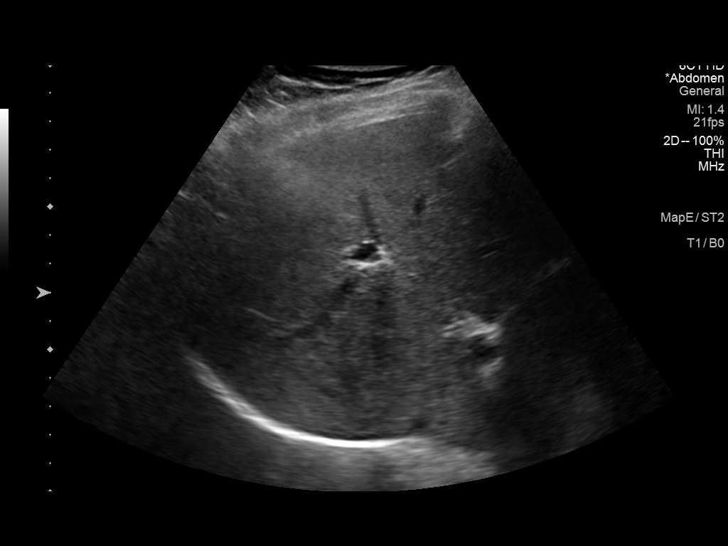
[im 50/92]
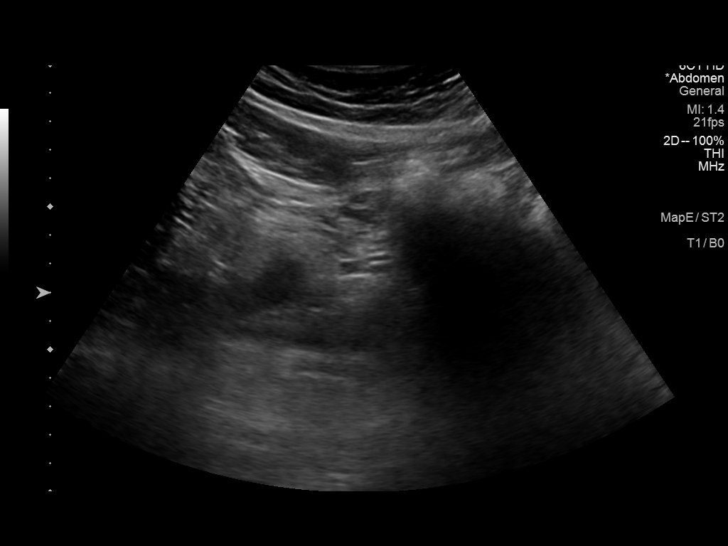
[im 57/92]
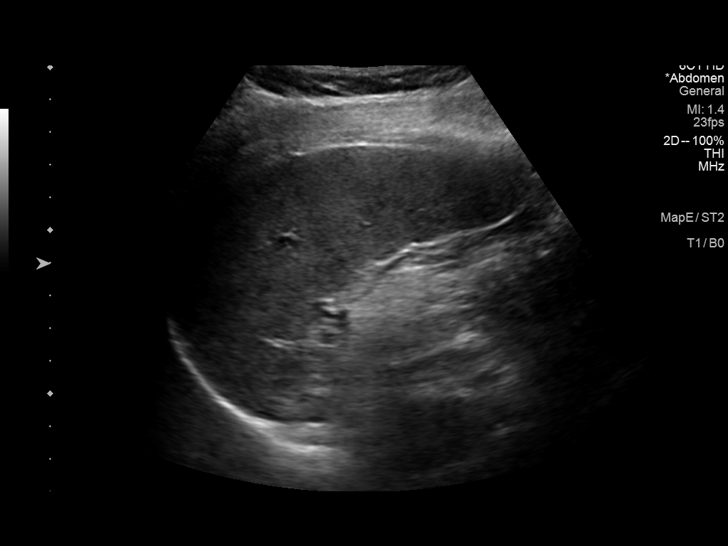
[im 61/92]
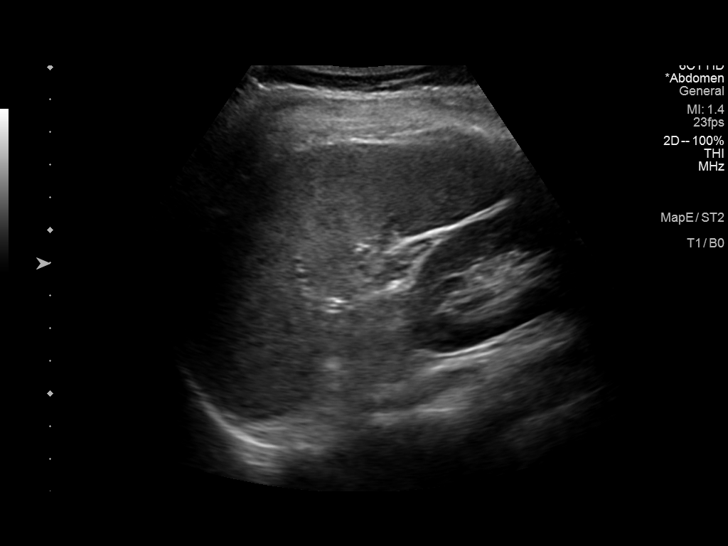
[im 69/92]
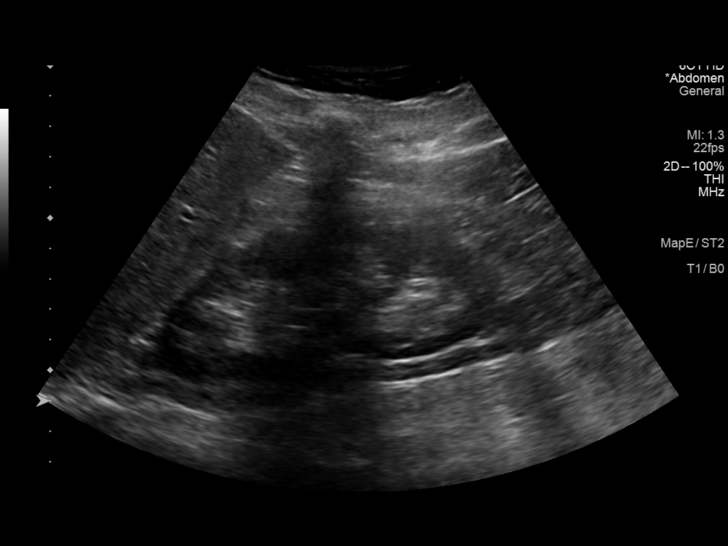
[im 76/92]
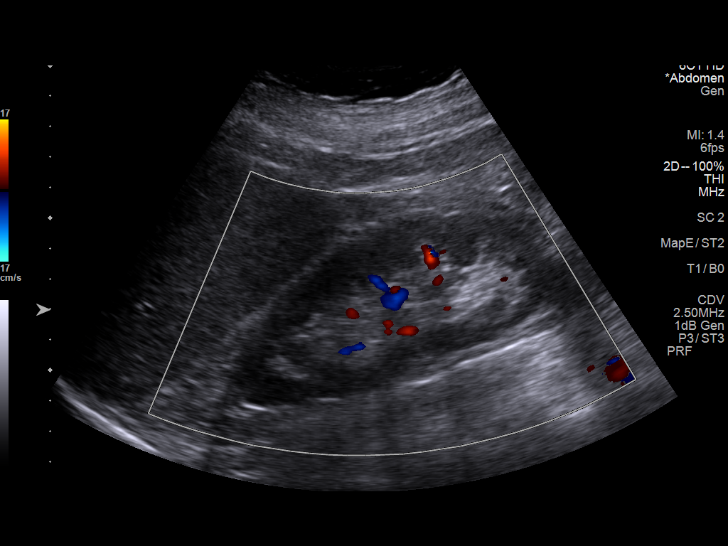
[im 84/92]
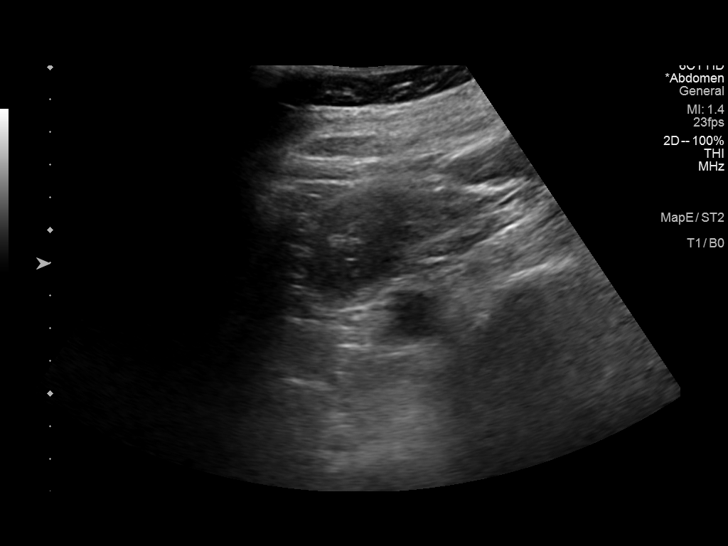
[im 92/92]
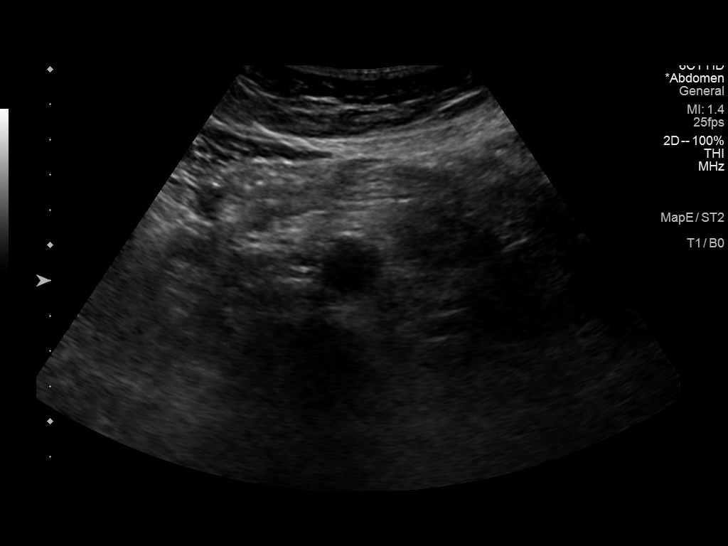

[14 of 25 positions shown; findings below may reference images not displayed]

FINDINGS: Gallbladder: Normally distended without stones or wall thickening.
No pericholecystic fluid or sonographic Murphy sign.

Common bile duct: Diameter: 2 mm, normal

Liver: Normal echogenicity without mass or nodularity. No
intrahepatic biliary dilatation. Portal vein is patent on color
Doppler imaging with normal direction of blood flow towards the
liver.

IVC: Normal appearance

Pancreas: Normal appearance

Spleen: Normal appearance, 11.2 cm length

Right Kidney: Length: 11.9 cm. Normal morphology without mass or
hydronephrosis.

Left Kidney: Length: 12.4 cm. Normal morphology without mass or
hydronephrosis.

Abdominal aorta: 2 normal caliber

Other findings: No free fluid
IMPRESSION: Normal exam.

## 2022-12-16 LAB — COLOGUARD: COLOGUARD: NEGATIVE

## 2023-05-16 DIAGNOSIS — M62830 Muscle spasm of back: Secondary | ICD-10-CM | POA: Diagnosis not present

## 2023-05-16 DIAGNOSIS — M545 Low back pain, unspecified: Secondary | ICD-10-CM | POA: Diagnosis not present

## 2023-05-24 DIAGNOSIS — Z6828 Body mass index (BMI) 28.0-28.9, adult: Secondary | ICD-10-CM | POA: Diagnosis not present

## 2023-05-24 DIAGNOSIS — L1230 Acquired epidermolysis bullosa, unspecified: Secondary | ICD-10-CM | POA: Diagnosis not present

## 2023-06-24 DIAGNOSIS — Z6827 Body mass index (BMI) 27.0-27.9, adult: Secondary | ICD-10-CM | POA: Diagnosis not present

## 2023-06-24 DIAGNOSIS — R109 Unspecified abdominal pain: Secondary | ICD-10-CM | POA: Diagnosis not present

## 2023-07-28 ENCOUNTER — Encounter: Payer: Self-pay | Admitting: Dermatology

## 2023-07-28 ENCOUNTER — Ambulatory Visit (INDEPENDENT_AMBULATORY_CARE_PROVIDER_SITE_OTHER): Payer: 59 | Admitting: Dermatology

## 2023-07-28 VITALS — BP 115/79 | HR 72

## 2023-07-28 DIAGNOSIS — Z86018 Personal history of other benign neoplasm: Secondary | ICD-10-CM

## 2023-07-28 DIAGNOSIS — L570 Actinic keratosis: Secondary | ICD-10-CM | POA: Diagnosis not present

## 2023-07-28 DIAGNOSIS — Z7189 Other specified counseling: Secondary | ICD-10-CM | POA: Diagnosis not present

## 2023-07-28 DIAGNOSIS — R238 Other skin changes: Secondary | ICD-10-CM

## 2023-07-28 DIAGNOSIS — D229 Melanocytic nevi, unspecified: Secondary | ICD-10-CM | POA: Insufficient documentation

## 2023-07-28 MED ORDER — CLOTRIMAZOLE-BETAMETHASONE 1-0.05 % EX CREA: 1.0000 | TOPICAL_CREAM | Freq: Two times a day (BID) | CUTANEOUS | 2 refills | Status: AC

## 2023-07-28 NOTE — Progress Notes (Signed)
New Patient Visit   Subjective  Shannon Padilla is a 52 y.o. female who presents for the following: spots of concern  Patient states she has spots located  would like to have on the nose she would like examined. Patient reports the areas have been there for a long time. She reports the area is bothersome. She states that the area is scaly and flaky. Patient reports it has not previously been treated. Patient has no Hx of skin cancer but has had multiple Dns. Patient has family history of skin cancer(s).   Pt has a rash on her legs on that has been there for several weeks. It is itchy occasionally. She has been using an old prescription topical steroid on it and it help somewhat but it is still there.  The patient has spots, moles and lesions to be evaluated, some may be new or changing and the patient may have concern these could be cancer.  Biopsy History From Prior Derm Notes  Melanoma/Nevi history:  Date Diagnosis Location  06/2015 Dysplastic nevus with severe atypia Left upper back  06/2015 Dysplastic nevus with moderate to severe atypia Right lateral upper back  07/2015 Dysplastic nevus with mild atypia Left lower abdomen  07/2015 Dysplastic nevus with moderate atypia Left anterior hip  10/2015 Dysplastic nevus with moderate to severe atypia Left of spine mid back  10/2015 Dysplastic nevus with severe atypia Left of spine lower back  11/2015 Dysplastic nevus with moderate atypia Left upper back  11/2015 Dysplastic nevus with moderate atypia Mid back left of spine  12/2015 Dysplastic nevus with mild to moderate atypia R lat upper back  12/2015 Dysplastic nevus with mild atypia L mid back  01/2016 Dysplastic nevus with moderate atypia R ant upper forrearm  01/2016 Dysplastic nevus with moderate to severe atypia L upper back  03/2017 Dysplastic nevus with mild atypia Center mid abdomen  03/2017 Dysplastic nevus with mild atypia Left abdomen  04/2017 Dysplastic nevus with severe atypia L upper  arm  07/2017 Dysplastic nevus with moderate atypia Right abdomen  07/2017 Dysplastic nevus with moderate atypia Right dorsal foot  08/2017 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  Objective  Well appearing patient in no apparent distress; mood and affect are within normal limits.   A focused examination was performed of the following areas: Nose and left lower leg  Relevant exam findings are noted in the Assessment and Plan.  Exam Scaly pink papules with gritty texture on the leg  Exam: scattered tan macules on arms and chest  Left Ala Nasi,left cheek (2) Pink scaly papules                Assessment & Plan   1. Atypical Nevi - Assessment: Patient with a history of multiple atypical nevi. No new concerning lesions identified during today's visit. - Plan: Continue to monitor for any changes in existing nevi or the development of new concerning lesions. Schedule a full body skin check in 6 months or sooner if needed.  2. Suspected Actinic Keratosis on the Nose - Assessment: Patient presents with a rough, pink spot on the nose,  persisting for a while. - Plan: Cryotherapy performed today. Instruct patient to apply Vaseline or Aquaphor every morning and evening to promote healing. Schedule a follow-up visit in 6 months for a light freeze and to prescribe topical chemotherapy cream (to be used during winter months).  3. Possible Ringworm or Irritated Skin on the Leg - Assessment: Patient presents with a red spot on the leg, present for a few weeks. Differential diagnosis includes ringworm or irritated  skin. - Plan: Prescribe clotrimazole betamethasone cream, to be applied twice daily for three weeks. The clotrimazole will address any potential fungal infection, while the betamethasone will reduce inflammation. Instruct patient to follow up if the lesion does not improve or worsens.  4. Sun Protection Education - Assessment: Patient reports minimal sun exposure but shows signs of everyday exposure, such as from driving. - Plan: Educate patient on the importance of using sunscreen to prevent further skin damage and potential skin cancers. Encourage daily use of broad-spectrum sunscreen with at least SPF 30.  Assessment & Plan   Multiple atypical nevi  AK (actinic keratosis) (2) Left Ala Nasi,left cheek  Destruction of lesion - Left Ala Nasi,left cheek (2) Complexity: simple   Destruction method: cryotherapy   Informed consent: discussed and consent obtained   Timeout:  patient name, date of birth, surgical site, and procedure verified Lesion destroyed using liquid nitrogen: Yes   Post-procedure details: wound care instructions given     -Apply thick layer of vaseline on treated spots   Return in about 6 months (around 01/28/2024) for TBSE.  Owens Shark, CMA, am acting as scribe for [employer], DO.   Documentation: I have reviewed the above documentation for accuracy and completeness, and I agree with the above.  Langston Reusing, DO

## 2023-07-28 NOTE — Patient Instructions (Addendum)
Cryotherapy Aftercare  Wash gently with soap and water everyday.   Apply Vaseline and Band-Aid daily until healed.   Due to recent changes in healthcare laws, you may see results of your pathology and/or laboratory studies on MyChart before the doctors have had a chance to review them. We understand that in some cases there may be results that are confusing or concerning to you. Please understand that not all results are received at the same time and often the doctors may need to interpret multiple results in order to provide you with the best plan of care or course of treatment. Therefore, we ask that you please give [place] 2 business days to thoroughly review all your results before contacting the office for clarification. Should we see a critical lab result, you will be contacted sooner.   If You Need Anything After Your Visit  If you have any questions or concerns for your doctor, please call our main line at [phone] If no one answers, please leave a voicemail as directed and we will return your call as soon as possible. Messages left after 4 pm will be answered the following business day.   You may also send [place] a message via [place]. We typically respond to MyChart messages within 1-2 business days.  For prescription refills, please ask your pharmacy to contact our office. Our fax number is [phone].  If you have an urgent issue when the clinic is closed that cannot wait until the next business day, you can page your doctor at the number below.    Please note that while we do our best to be available for urgent issues outside of office hours, we are not available 24/7.   If you have an urgent issue and are unable to reach [place], you may choose to seek medical care at your doctor's office, retail clinic, urgent care center, or emergency room.  If you have a medical emergency, please immediately call 911 or go to the emergency department. In the event of inclement weather, please call our  main line at [phone] for an update on the status of any delays or closures.  Dermatology Medication Tips: Please keep the boxes that topical medications come in in order to help keep track of the instructions about where and how to use these. Pharmacies typically print the medication instructions only on the boxes and not directly on the medication tubes.   If your medication is too expensive, please contact our office at [phone] or send [place] a message through [place].   We are unable to tell what your co-pay for medications will be in advance as this is different depending on your insurance coverage. However, we may be able to find a substitute medication at lower cost or fill out paperwork to get insurance to cover a needed medication.   If a prior authorization is required to get your medication covered by your insurance company, please allow [place] 1-2 business days to complete this process.  Drug prices often vary depending on where the prescription is filled and some pharmacies may offer cheaper prices.  The website www.goodrx.com contains coupons for medications through different pharmacies. The prices here do not account for what the cost may be with help from insurance (it may be cheaper with your insurance), but the website can give you the price if you did not use any insurance.  - You can print the associated coupon and take it with your prescription to the pharmacy.  - You may also  stop by our office during regular business hours and pick up a GoodRx coupon card.  - If you need your prescription sent electronically to a different pharmacy, notify our office through [facility] or by phone at [phone]

## 2023-10-21 DIAGNOSIS — M25562 Pain in left knee: Secondary | ICD-10-CM | POA: Diagnosis not present

## 2023-10-21 DIAGNOSIS — Z6828 Body mass index (BMI) 28.0-28.9, adult: Secondary | ICD-10-CM | POA: Diagnosis not present

## 2023-11-03 DIAGNOSIS — M25562 Pain in left knee: Secondary | ICD-10-CM | POA: Diagnosis not present

## 2023-11-30 DIAGNOSIS — Z01419 Encounter for gynecological examination (general) (routine) without abnormal findings: Secondary | ICD-10-CM | POA: Diagnosis not present

## 2023-11-30 DIAGNOSIS — R3915 Urgency of urination: Secondary | ICD-10-CM | POA: Diagnosis not present

## 2023-11-30 DIAGNOSIS — Z6827 Body mass index (BMI) 27.0-27.9, adult: Secondary | ICD-10-CM | POA: Diagnosis not present

## 2023-11-30 DIAGNOSIS — Z01411 Encounter for gynecological examination (general) (routine) with abnormal findings: Secondary | ICD-10-CM | POA: Diagnosis not present

## 2023-11-30 DIAGNOSIS — Z1231 Encounter for screening mammogram for malignant neoplasm of breast: Secondary | ICD-10-CM | POA: Diagnosis not present

## 2023-11-30 DIAGNOSIS — F411 Generalized anxiety disorder: Secondary | ICD-10-CM | POA: Diagnosis not present

## 2023-11-30 DIAGNOSIS — Z1389 Encounter for screening for other disorder: Secondary | ICD-10-CM | POA: Diagnosis not present

## 2023-12-14 ENCOUNTER — Ambulatory Visit: Payer: 59 | Admitting: Dermatology

## 2024-04-23 ENCOUNTER — Other Ambulatory Visit: Payer: Self-pay

## 2024-04-23 ENCOUNTER — Emergency Department (HOSPITAL_COMMUNITY)
Admission: EM | Admit: 2024-04-23 | Discharge: 2024-04-23 | Disposition: A | Discharge status: Home / Self Care | Attending: Emergency Medicine | Admitting: Emergency Medicine

## 2024-04-23 DIAGNOSIS — S0990XA Unspecified injury of head, initial encounter: Secondary | ICD-10-CM

## 2024-04-23 DIAGNOSIS — W01198A Fall on same level from slipping, tripping and stumbling with subsequent striking against other object, initial encounter: Secondary | ICD-10-CM | POA: Insufficient documentation

## 2024-04-23 DIAGNOSIS — Y92009 Unspecified place in unspecified non-institutional (private) residence as the place of occurrence of the external cause: Secondary | ICD-10-CM | POA: Insufficient documentation

## 2024-04-23 DIAGNOSIS — S0083XA Contusion of other part of head, initial encounter: Secondary | ICD-10-CM | POA: Diagnosis not present

## 2024-04-23 NOTE — ED Provider Notes (Signed)
 [facility] AT [facility] Provider Note   CSN: [id number] Arrival date & time: 04/23/24  4098     History  Chief Complaint  Patient presents with   Head Injury    Shannon Padilla is a 53 y.o. female who presents for evaluation of head injury sustained this morning.  Patient states she regularly gets up 3-4 times per night to urinate at home and occasionally will not be completely awake during.  She states that today she woke up as she fell and hit the back of her head on the side table.  No LOC from the fall, no vomiting but did have some nausea and required sitting there for a few minutes before she felt comfortable standing secondary to the nausea and lightheadedness.  No blurry or double vision.  Accompanied by her husband at the bedside who states that she is behaving normally for herself.  No history of anticoagulation.  She is not amnestic to getting in bed but does states she sometimes will walk to the restroom without being completely awake and does not remember walking from her bed to the restroom though she was in the route when she fell.  HPI     Home Medications Prior to Admission medications   Medication Sig Start Date End Date Taking? Authorizing Provider  clotrimazole -betamethasone  (LOTRISONE ) cream Apply 1 Application topically 2 (two) times daily. 07/28/23   Dellar Fenton, DO  gabapentin  (NEURONTIN ) 300 MG capsule Take 1 capsule (300 mg total) by mouth at bedtime. 07/09/21 10/07/21  Penumalli, Vikram R, MD  oxyCODONE  (OXY IR/ROXICODONE ) 5 MG immediate release tablet Take 1 tablet (5 mg total) by mouth every 4 (four) hours as needed for severe pain. 08/15/22   Faith Homes, MD  sertraline  (ZOLOFT ) 50 MG tablet Take 50 mg by mouth daily. 11/23/20   [provider]      Allergies    Ampicillin , Tinidazole, and Metronidazole    Review of Systems   Review of Systems  Skin:  Positive for wound.    Physical Exam Updated Vital  Signs BP 129/71 (BP Location: Right Arm)   Pulse 71   Temp 97.9 F (36.6 C) (Oral)   Resp 17   SpO2 96%  Physical Exam Vitals and nursing note reviewed.  Constitutional:      Appearance: She is not ill-appearing or toxic-appearing.  HENT:     Head:      Mouth/Throat:     Mouth: Mucous membranes are moist.     Dentition: Normal dentition.     Pharynx: No oropharyngeal exudate or posterior oropharyngeal erythema.  Eyes:     General:        Right eye: No discharge.        Left eye: No discharge.     Extraocular Movements: Extraocular movements intact.     Conjunctiva/sclera: Conjunctivae normal.     Pupils: Pupils are equal, round, and reactive to light.  Cardiovascular:     Rate and Rhythm: Normal rate and regular rhythm.     Pulses: Normal pulses.     Heart sounds: Normal heart sounds. No murmur heard. Pulmonary:     Effort: Pulmonary effort is normal. No respiratory distress.     Breath sounds: Normal breath sounds. No wheezing or rales.  Abdominal:     General: Bowel sounds are normal. There is no distension.     Palpations: Abdomen is soft.     Tenderness: There is no abdominal tenderness. There  is no guarding or rebound.  Musculoskeletal:        General: No deformity.     Cervical back: Neck supple.  Skin:    General: Skin is warm and dry.  Neurological:     Mental Status: She is alert. Mental status is at baseline.     GCS: GCS eye subscore is 4. GCS verbal subscore is 5. GCS motor subscore is 6.     Cranial Nerves: Cranial nerves 2-12 are intact.     Sensory: Sensation is intact.     Motor: Motor function is intact.     Coordination: Coordination is intact.     Gait: Gait is intact.  Psychiatric:        Mood and Affect: Mood normal.     ED Results / Procedures / Treatments   Labs (all labs ordered are listed, but only abnormal results are displayed) Labs Reviewed - No data to display  EKG None  Radiology No results found.  Procedures Procedures     Medications Ordered in ED Medications - No data to display  ED Course/ Medical Decision Making/ A&P                                 Medical Decision Making 53 year old female who presents after fall with small laceration to her head.  Normal vitals on intake.  Clinical exam unremarkable, abdominal exam is benign.  Patient is neurologically intact without focal deficit.  Right knee abrasion over the Posterior parietal occipital skull on the left with associated hematoma likely site of striking the bedside table.  Nothing that will require repair.   Clinically picture is most consistent with mild concussion.  Per [place] head CT rules patient does not require CT at this time.  Patient was GCS 15, ambulatory in the emergency department and tolerating p.o.  Analgesia offered, patient declined.  Last tetanus vaccination in 2023 therefore she is up-to-date.  Clinical concern for emergent underlying condition that would warrant further ED workup or patient management is exceedingly low.  Shannon Padilla and her husband voiced understanding of her medical evaluation and treatment plan. Each of their questions answered to their expressed satisfaction.  Return precautions were given.  Patient is well-appearing, stable, and was discharged in good condition.  This chart was dictated using voice recognition software, Dragon. Despite the best efforts of this provider to proofread and correct errors, errors may still occur which can change documentation meaning.         Final Clinical Impression(s) / ED Diagnoses Final diagnoses:  Injury of head, initial encounter    Rx / DC Orders ED Discharge Orders     None         Kae Oram, PA-C 04/23/24 0531    Iva Mariner, MD 04/23/24 [phone]

## 2024-04-23 NOTE — Discharge Instructions (Signed)
 You likely sustained a mild concussion in your fall tonight. You may take Tylenol  650 mg every 4 hours as needed for headache. It is very important that you rest over the next 48 hours. You should also not participate in any exercise or sports for at least 7 days and until you have no symptoms including headache, dizziness, nausea, or vomiting. Once your symptoms have resolved and you have gone at least 7 days following your injury, you may gradually return to light exercise as directed by your regular primary care provider. Return to the emergency department for repeat evaluation if you develop more than 2 episodes of vomiting, worsening headache despite use of Tylenol , difficulties with speech or balance, or other new concerns.

## 2024-04-23 NOTE — ED Triage Notes (Signed)
 POV, pt was ambulating to bathroom from bed, does not recall events leading to fall. Woke up to impact of her falling, noticed some blood on the posterior side of the head.   Fall happened at 300, pt became nauseous, and "took it a little time to get back up".   Pt alert on triage, pupils equal round and reactive, 3 out of 10.   Present RN noticed small laceration bleeding under control.

## 2024-12-13 ENCOUNTER — Ambulatory Visit

## 2024-12-13 ENCOUNTER — Other Ambulatory Visit: Payer: Self-pay | Admitting: Obstetrics and Gynecology

## 2024-12-13 ENCOUNTER — Ambulatory Visit
Admission: RE | Admit: 2024-12-13 | Discharge: 2024-12-13 | Disposition: A | Discharge status: Home / Self Care | Source: Ambulatory Visit | Attending: Obstetrics and Gynecology | Admitting: Obstetrics and Gynecology

## 2024-12-13 DIAGNOSIS — N6489 Other specified disorders of breast: Secondary | ICD-10-CM
